# Patient Record
Sex: Male | Born: 1960 | Race: Black or African American | Hispanic: No | Marital: Single | Smoking: Current every day smoker
Health system: Southern US, Community
[De-identification: ages and names within clinical notes are randomized; demographics above are authoritative.]

## PROBLEM LIST (undated history)

## (undated) DIAGNOSIS — F419 Anxiety disorder, unspecified: Secondary | ICD-10-CM

## (undated) DIAGNOSIS — I2510 Atherosclerotic heart disease of native coronary artery without angina pectoris: Secondary | ICD-10-CM

## (undated) DIAGNOSIS — C499 Malignant neoplasm of connective and soft tissue, unspecified: Secondary | ICD-10-CM

## (undated) DIAGNOSIS — K219 Gastro-esophageal reflux disease without esophagitis: Secondary | ICD-10-CM

## (undated) DIAGNOSIS — R569 Unspecified convulsions: Secondary | ICD-10-CM

## (undated) DIAGNOSIS — Z720 Tobacco use: Secondary | ICD-10-CM

## (undated) DIAGNOSIS — I4891 Unspecified atrial fibrillation: Secondary | ICD-10-CM

## (undated) DIAGNOSIS — Z8719 Personal history of other diseases of the digestive system: Secondary | ICD-10-CM

## (undated) DIAGNOSIS — I428 Other cardiomyopathies: Secondary | ICD-10-CM

## (undated) DIAGNOSIS — I4892 Unspecified atrial flutter: Secondary | ICD-10-CM

## (undated) DIAGNOSIS — J189 Pneumonia, unspecified organism: Secondary | ICD-10-CM

## (undated) DIAGNOSIS — R0602 Shortness of breath: Secondary | ICD-10-CM

## (undated) DIAGNOSIS — I5022 Chronic systolic (congestive) heart failure: Secondary | ICD-10-CM

## (undated) DIAGNOSIS — R51 Headache: Secondary | ICD-10-CM

## (undated) HISTORY — PX: OTHER SURGICAL HISTORY: SHX169

## (undated) HISTORY — DX: Chronic systolic (congestive) heart failure: I50.22

## (undated) HISTORY — DX: Unspecified atrial fibrillation: I48.91

## (undated) HISTORY — DX: Other cardiomyopathies: I42.8

## (undated) HISTORY — DX: Atherosclerotic heart disease of native coronary artery without angina pectoris: I25.10

---
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
## 1989-06-01 HISTORY — PX: HIATAL HERNIA REPAIR: SHX195

## 2013-09-01 ENCOUNTER — Inpatient Hospital Stay (HOSPITAL_COMMUNITY)
Admission: EM | Admit: 2013-09-01 | Discharge: 2013-09-06 | DRG: 286 | Disposition: A | Discharge status: Home / Self Care | Payer: Medicaid Other | Attending: Internal Medicine | Admitting: Internal Medicine

## 2013-09-01 ENCOUNTER — Emergency Department (HOSPITAL_COMMUNITY): Payer: Medicaid Other

## 2013-09-01 ENCOUNTER — Encounter (HOSPITAL_COMMUNITY): Payer: Self-pay | Admitting: Emergency Medicine

## 2013-09-01 DIAGNOSIS — I428 Other cardiomyopathies: Secondary | ICD-10-CM | POA: Diagnosis present

## 2013-09-01 DIAGNOSIS — F17200 Nicotine dependence, unspecified, uncomplicated: Secondary | ICD-10-CM | POA: Diagnosis present

## 2013-09-01 DIAGNOSIS — I5022 Chronic systolic (congestive) heart failure: Secondary | ICD-10-CM

## 2013-09-01 DIAGNOSIS — E876 Hypokalemia: Secondary | ICD-10-CM | POA: Diagnosis not present

## 2013-09-01 DIAGNOSIS — Z23 Encounter for immunization: Secondary | ICD-10-CM | POA: Diagnosis not present

## 2013-09-01 DIAGNOSIS — I4891 Unspecified atrial fibrillation: Secondary | ICD-10-CM

## 2013-09-01 DIAGNOSIS — I2510 Atherosclerotic heart disease of native coronary artery without angina pectoris: Secondary | ICD-10-CM | POA: Diagnosis present

## 2013-09-01 DIAGNOSIS — I509 Heart failure, unspecified: Secondary | ICD-10-CM | POA: Diagnosis present

## 2013-09-01 DIAGNOSIS — Z923 Personal history of irradiation: Secondary | ICD-10-CM

## 2013-09-01 DIAGNOSIS — I4892 Unspecified atrial flutter: Principal | ICD-10-CM

## 2013-09-01 DIAGNOSIS — I4460 Unspecified fascicular block: Secondary | ICD-10-CM | POA: Diagnosis not present

## 2013-09-01 DIAGNOSIS — Z720 Tobacco use: Secondary | ICD-10-CM

## 2013-09-01 DIAGNOSIS — C499 Malignant neoplasm of connective and soft tissue, unspecified: Secondary | ICD-10-CM | POA: Insufficient documentation

## 2013-09-01 DIAGNOSIS — R7402 Elevation of levels of lactic acid dehydrogenase (LDH): Secondary | ICD-10-CM | POA: Diagnosis not present

## 2013-09-01 DIAGNOSIS — I5021 Acute systolic (congestive) heart failure: Secondary | ICD-10-CM | POA: Diagnosis present

## 2013-09-01 DIAGNOSIS — F1210 Cannabis abuse, uncomplicated: Secondary | ICD-10-CM | POA: Diagnosis present

## 2013-09-01 DIAGNOSIS — R748 Abnormal levels of other serum enzymes: Secondary | ICD-10-CM | POA: Diagnosis present

## 2013-09-01 DIAGNOSIS — R7401 Elevation of levels of liver transaminase levels: Secondary | ICD-10-CM

## 2013-09-01 DIAGNOSIS — Z85828 Personal history of other malignant neoplasm of skin: Secondary | ICD-10-CM | POA: Diagnosis not present

## 2013-09-01 DIAGNOSIS — I456 Pre-excitation syndrome: Secondary | ICD-10-CM | POA: Diagnosis not present

## 2013-09-01 DIAGNOSIS — I2589 Other forms of chronic ischemic heart disease: Secondary | ICD-10-CM | POA: Diagnosis present

## 2013-09-01 DIAGNOSIS — R002 Palpitations: Secondary | ICD-10-CM

## 2013-09-01 DIAGNOSIS — E049 Nontoxic goiter, unspecified: Secondary | ICD-10-CM | POA: Diagnosis present

## 2013-09-01 DIAGNOSIS — I10 Essential (primary) hypertension: Secondary | ICD-10-CM | POA: Diagnosis present

## 2013-09-01 DIAGNOSIS — R6881 Early satiety: Secondary | ICD-10-CM | POA: Diagnosis present

## 2013-09-01 HISTORY — DX: Malignant neoplasm of connective and soft tissue, unspecified: C49.9

## 2013-09-01 HISTORY — DX: Tobacco use: Z72.0

## 2013-09-01 HISTORY — DX: Unspecified atrial flutter: I48.92

## 2013-09-01 LAB — POCT I-STAT TROPONIN I: Troponin i, poc: 0.11 ng/mL (ref 0.00–0.08)

## 2013-09-01 LAB — BASIC METABOLIC PANEL
BUN: 21 mg/dL (ref 6–23)
CO2: 24 mEq/L (ref 19–32)
CO2: 26 mEq/L (ref 19–32)
Calcium: 8.6 mg/dL (ref 8.4–10.5)
Chloride: 96 mEq/L (ref 96–112)
Creatinine, Ser: 1.4 mg/dL — ABNORMAL HIGH (ref 0.50–1.35)
GFR calc non Af Amer: 56 mL/min — ABNORMAL LOW (ref >90)
Glucose, Bld: 119 mg/dL — ABNORMAL HIGH (ref 70–99)
Potassium: 3.4 mEq/L — ABNORMAL LOW (ref 3.5–5.1)
Potassium: 3.4 mEq/L — ABNORMAL LOW (ref 3.5–5.1)
Sodium: 135 mEq/L (ref 135–145)
Sodium: 136 mEq/L (ref 135–145)

## 2013-09-01 LAB — URINALYSIS, ROUTINE W REFLEX MICROSCOPIC
Glucose, UA: NEGATIVE mg/dL
Hgb urine dipstick: NEGATIVE
Ketones, ur: 15 mg/dL — AB
Leukocytes, UA: NEGATIVE
Protein, ur: NEGATIVE mg/dL
Urobilinogen, UA: 1 mg/dL (ref 0.0–1.0)

## 2013-09-01 LAB — HEPARIN LEVEL (UNFRACTIONATED): Heparin Unfractionated: <0.1 IU/mL — ABNORMAL LOW (ref 0.30–0.70)

## 2013-09-01 LAB — CBC
Platelets: 214 10*3/uL (ref 150–400)
RBC: 4.14 MIL/uL — ABNORMAL LOW (ref 4.22–5.81)
WBC: 7.9 10*3/uL (ref 4.0–10.5)

## 2013-09-01 LAB — TSH: TSH: 0.818 u[IU]/mL (ref 0.350–4.500)

## 2013-09-01 LAB — RAPID URINE DRUG SCREEN, HOSP PERFORMED
Amphetamines: NOT DETECTED
Benzodiazepines: NOT DETECTED
Opiates: NOT DETECTED
Tetrahydrocannabinol: POSITIVE — AB

## 2013-09-01 LAB — CK TOTAL AND CKMB (NOT AT ARMC): Relative Index: 2.1 (ref 0.0–2.5)

## 2013-09-01 LAB — T3, FREE: T3, Free: 3.4 pg/mL (ref 2.3–4.2)

## 2013-09-01 LAB — HEMOGLOBIN A1C
Hgb A1c MFr Bld: 5.7 % — ABNORMAL HIGH (ref <5.7)
Mean Plasma Glucose: 117 mg/dL — ABNORMAL HIGH (ref <117)

## 2013-09-01 LAB — MRSA PCR SCREENING: MRSA by PCR: NEGATIVE

## 2013-09-01 LAB — PRO B NATRIURETIC PEPTIDE: Pro B Natriuretic peptide (BNP): 5936 pg/mL — ABNORMAL HIGH (ref 0–125)

## 2013-09-01 LAB — CK: Total CK: 354 U/L — ABNORMAL HIGH (ref 7–232)

## 2013-09-01 LAB — HEPATIC FUNCTION PANEL
Albumin: 3.4 g/dL — ABNORMAL LOW (ref 3.5–5.2)
Alkaline Phosphatase: 75 U/L (ref 39–117)
Bilirubin, Direct: 0.2 mg/dL (ref 0.0–0.3)
Indirect Bilirubin: 0.7 mg/dL (ref 0.3–0.9)
Total Bilirubin: 0.9 mg/dL (ref 0.3–1.2)

## 2013-09-01 LAB — MAGNESIUM: Magnesium: 1.9 mg/dL (ref 1.5–2.5)

## 2013-09-01 LAB — TROPONIN I: Troponin I: 0.34 ng/mL (ref <0.30)

## 2013-09-01 MED ORDER — PNEUMOCOCCAL VAC POLYVALENT 25 MCG/0.5ML IJ INJ
0.5000 mL | INJECTION | INTRAMUSCULAR | Status: DC
  Filled 2013-09-01 (×2): qty 0.5

## 2013-09-01 MED ORDER — DILTIAZEM HCL 25 MG/5ML IV SOLN
10.0000 mg | Freq: Once | INTRAVENOUS | Status: AC
  Administered 2013-09-01: 10 mg via INTRAVENOUS

## 2013-09-01 MED ORDER — LORAZEPAM 2 MG/ML IJ SOLN
1.0000 mg | Freq: Once | INTRAMUSCULAR | Status: AC
  Administered 2013-09-01: 1 mg via INTRAVENOUS
  Filled 2013-09-01: qty 1

## 2013-09-01 MED ORDER — DIPHENHYDRAMINE HCL 50 MG PO CAPS
50.0000 mg | ORAL_CAPSULE | Freq: Once | ORAL | Status: AC
  Administered 2013-09-01: 50 mg via ORAL
  Filled 2013-09-01: qty 1

## 2013-09-01 MED ORDER — METOPROLOL TARTRATE 1 MG/ML IV SOLN
5.0000 mg | Freq: Four times a day (QID) | INTRAVENOUS | Status: DC | PRN
  Administered 2013-09-01: 5 mg via INTRAVENOUS
  Filled 2013-09-01: qty 5

## 2013-09-01 MED ORDER — DILTIAZEM HCL 100 MG IV SOLR
20.0000 mg/h | INTRAVENOUS | Status: DC
  Administered 2013-09-01 (×2): 20 mg/h via INTRAVENOUS
  Administered 2013-09-01 (×2): 5 mg/h via INTRAVENOUS
  Filled 2013-09-01: qty 100

## 2013-09-01 MED ORDER — POTASSIUM CHLORIDE CRYS ER 20 MEQ PO TBCR
40.0000 meq | EXTENDED_RELEASE_TABLET | Freq: Two times a day (BID) | ORAL | Status: AC
  Administered 2013-09-01 (×2): 40 meq via ORAL
  Filled 2013-09-01: qty 2

## 2013-09-01 MED ORDER — METOPROLOL TARTRATE 1 MG/ML IV SOLN
5.0000 mg | Freq: Once | INTRAVENOUS | Status: AC
  Administered 2013-09-01: 5 mg via INTRAVENOUS
  Filled 2013-09-01: qty 5

## 2013-09-01 MED ORDER — LORAZEPAM 2 MG/ML IJ SOLN
1.0000 mg | Freq: Once | INTRAMUSCULAR | Status: AC
  Administered 2013-09-01: 1 mg via INTRAVENOUS

## 2013-09-01 MED ORDER — FUROSEMIDE 10 MG/ML IJ SOLN
80.0000 mg | Freq: Once | INTRAMUSCULAR | Status: AC
  Administered 2013-09-01: 80 mg via INTRAVENOUS
  Filled 2013-09-01: qty 8

## 2013-09-01 MED ORDER — PROPRANOLOL HCL 1 MG/ML IV SOLN
1.0000 mg | INTRAVENOUS | Status: AC
  Administered 2013-09-01: 1 mg via INTRAVENOUS
  Filled 2013-09-01: qty 1

## 2013-09-01 MED ORDER — ALPRAZOLAM 0.5 MG PO TABS
0.5000 mg | ORAL_TABLET | Freq: Once | ORAL | Status: AC
  Administered 2013-09-01: 0.5 mg via ORAL
  Filled 2013-09-01: qty 1

## 2013-09-01 MED ORDER — OXYCODONE HCL 5 MG PO TABS
5.0000 mg | ORAL_TABLET | ORAL | Status: DC | PRN
  Filled 2013-09-01: qty 1

## 2013-09-01 MED ORDER — HEPARIN BOLUS VIA INFUSION
2500.0000 [IU] | Freq: Once | INTRAVENOUS | Status: AC
  Administered 2013-09-01: 2500 [IU] via INTRAVENOUS
  Filled 2013-09-01: qty 2500

## 2013-09-01 MED ORDER — HYDROCORTISONE SOD SUCCINATE 100 MG IJ SOLR
100.0000 mg | Freq: Once | INTRAMUSCULAR | Status: AC
  Administered 2013-09-01: 100 mg via INTRAVENOUS
  Filled 2013-09-01: qty 2

## 2013-09-01 MED ORDER — FUROSEMIDE 10 MG/ML IJ SOLN
40.0000 mg | Freq: Once | INTRAMUSCULAR | Status: AC
  Administered 2013-09-01: 40 mg via INTRAVENOUS
  Filled 2013-09-01: qty 4

## 2013-09-01 MED ORDER — METOPROLOL TARTRATE 1 MG/ML IV SOLN
5.0000 mg | Freq: Four times a day (QID) | INTRAVENOUS | Status: DC
  Administered 2013-09-01 – 2013-09-02 (×2): 5 mg via INTRAVENOUS
  Filled 2013-09-01 (×6): qty 5

## 2013-09-01 MED ORDER — SODIUM CHLORIDE 0.9 % IJ SOLN: 3.0000 mL | Freq: Two times a day (BID) | INTRAMUSCULAR | Status: DC

## 2013-09-01 MED ORDER — FUROSEMIDE 10 MG/ML IJ SOLN
40.0000 mg | Freq: Two times a day (BID) | INTRAMUSCULAR | Status: DC
  Administered 2013-09-01 – 2013-09-03 (×4): 40 mg via INTRAVENOUS
  Filled 2013-09-01 (×6): qty 4

## 2013-09-01 MED ORDER — ZOLPIDEM TARTRATE 5 MG PO TABS: 5.0000 mg | ORAL_TABLET | Freq: Every evening | ORAL | Status: DC | PRN

## 2013-09-01 MED ORDER — POLYETHYLENE GLYCOL 3350 17 G PO PACK
17.0000 g | PACK | Freq: Every day | ORAL | Status: DC | PRN
  Filled 2013-09-01: qty 1

## 2013-09-01 MED ORDER — SODIUM CHLORIDE 0.9 % IV SOLN: 250.0000 mL | INTRAVENOUS | Status: DC | PRN

## 2013-09-01 MED ORDER — SODIUM CHLORIDE 0.9 % IV BOLUS (SEPSIS)
1000.0000 mL | Freq: Once | INTRAVENOUS | Status: AC
  Administered 2013-09-01: 1000 mL via INTRAVENOUS

## 2013-09-01 MED ORDER — METOPROLOL TARTRATE 1 MG/ML IV SOLN: 5.0000 mg | Freq: Four times a day (QID) | INTRAVENOUS | Status: DC | PRN

## 2013-09-01 MED ORDER — HEPARIN (PORCINE) IN NACL 100-0.45 UNIT/ML-% IJ SOLN
1650.0000 [IU]/h | INTRAMUSCULAR | Status: DC
  Administered 2013-09-01: 1450 [IU]/h via INTRAVENOUS
  Administered 2013-09-01: 1200 [IU]/h via INTRAVENOUS
  Filled 2013-09-01 (×4): qty 250

## 2013-09-01 MED ORDER — SODIUM CHLORIDE 0.9 % IJ SOLN: 3.0000 mL | INTRAMUSCULAR | Status: DC | PRN

## 2013-09-01 MED ORDER — HEPARIN BOLUS VIA INFUSION
3000.0000 [IU] | Freq: Once | INTRAVENOUS | Status: AC
  Administered 2013-09-01: 3000 [IU] via INTRAVENOUS
  Filled 2013-09-01: qty 3000

## 2013-09-01 MED ORDER — SODIUM CHLORIDE 0.9 % IV SOLN: INTRAVENOUS | Status: DC

## 2013-09-01 MED ORDER — DILTIAZEM HCL 25 MG/5ML IV SOLN
10.0000 mg | Freq: Once | INTRAVENOUS | Status: AC
  Administered 2013-09-01: 10 mg via INTRAVENOUS
  Filled 2013-09-01: qty 5

## 2013-09-01 MED ORDER — SODIUM CHLORIDE 0.9 % IV SOLN: 250.0000 mL | INTRAVENOUS | Status: DC

## 2013-09-01 MED ORDER — ONDANSETRON HCL 4 MG/2ML IJ SOLN: 4.0000 mg | Freq: Four times a day (QID) | INTRAMUSCULAR | Status: DC | PRN

## 2013-09-01 MED ORDER — INFLUENZA VAC SPLIT QUAD 0.5 ML IM SUSP
0.5000 mL | INTRAMUSCULAR | Status: DC
  Filled 2013-09-01 (×2): qty 0.5

## 2013-09-01 MED ORDER — IOHEXOL 350 MG/ML SOLN
80.0000 mL | Freq: Once | INTRAVENOUS | Status: AC | PRN
  Administered 2013-09-01: 100 mL via INTRAVENOUS

## 2013-09-01 MED ORDER — SODIUM CHLORIDE 0.9 % IJ SOLN
3.0000 mL | Freq: Two times a day (BID) | INTRAMUSCULAR | Status: DC
  Administered 2013-09-01: 3 mL via INTRAVENOUS

## 2013-09-01 MED ORDER — SODIUM CHLORIDE 0.9 % IJ SOLN
3.0000 mL | Freq: Two times a day (BID) | INTRAMUSCULAR | Status: DC
  Administered 2013-09-02: 3 mL via INTRAVENOUS

## 2013-09-01 MED ORDER — ONDANSETRON HCL 4 MG PO TABS: 4.0000 mg | ORAL_TABLET | Freq: Four times a day (QID) | ORAL | Status: DC | PRN

## 2013-09-01 NOTE — ED Notes (Signed)
Contacted bed control regarding delay in bed assignment, states there are not any step down level beds available

## 2013-09-01 NOTE — ED Notes (Signed)
MD at bedside. 

## 2013-09-01 NOTE — Progress Notes (Signed)
ANTICOAGULATION CONSULT NOTE - Initial Consult  Pharmacy Consult for heparin Indication: aflutter  Allergies  Allergen Reactions  . Acetaminophen     "shaky" "makes me angry"  . Peanut Butter Flavor     Breathing problems, swells lips    Patient Measurements:    Dosing Weight: 200#  Vital Signs: Temp: 97 F (36.1 C) (12/02 0234) Temp src: Oral (12/02 0234) BP: 138/104 mmHg (12/02 0828) Pulse Rate: 72 (12/02 0828)  Labs:  Recent Labs  09/01/13 0249 09/01/13 0531  HGB 14.6  --   HCT 41.0  --   PLT 214  --   CREATININE 1.19  --   CKTOTAL  --  354*    CrCl is unknown because there is no height on file for the current visit.   Medical History: Past Medical History  Diagnosis Date  . Granular cell myoblastoma, malignant     a. myoblastoma  approx 1998, in remission. underwent gamma knife  procedure  . Atrial flutter with rapid ventricular response   . Tobacco abuse     16 pack history   . Congestive heart failure     Medications:  See med rec  Assessment: 52 yo man to start heparin for a flutter.   Goal of Therapy:  Heparin level 0.3-0.7 units/ml Monitor platelets by anticoagulation protocol: Yes   Plan:  Give 3000 units bolus x 1 Start heparin infusion at 1200 units/hr Check anti-Xa level in 6 hours and daily while on heparin Continue to monitor H&H and platelets  Kasiya Burck Poteet 09/01/2013,9:02 AM

## 2013-09-01 NOTE — ED Notes (Signed)
Patient without c/os of chest pain noted rhythm 143, patient appears anxious

## 2013-09-01 NOTE — ED Notes (Addendum)
Paged admitting MD to update on persistent tachycardia and elevated Troponin, MD advises that no further interventions needed at this time if Pt's BP stable and is chest pain free

## 2013-09-01 NOTE — Consult Note (Signed)
CARDIOLOGY CONSULT NOTE   Patient ID: Justin Huerta MRN: [id number] DOB/AGE: [birth year] 54 y.o.  Admit date: 09/01/2013  Primary Physician  Dr. Trinda Pascal in [place]. Primary Cardiologist   New Reason for Consultation  Atrial flutter  HPI: Justin Huerta is a 52 y.o. male with a history of tobacco abuse, myoblastoma in remission and no past cardiac history who presented to the ED with atrial flutter with RVR. He was in his usual state of health until approx 2 weeks ago when he noticed fatigue, diaphoresis, palpitations and SOB. He went to the pharmacy and took his blood pressure; systolic was in the 180s and his HR 157. His symptoms come and go and have become worse over the past couple days. He has not slept for last two days secondary to agitation and sense of impending doom.  He admits to worsening SOB with chest tightness at rest, 3 pillow orthopnea, PND and early satiety for the past couple weeks, as well as bloating and gas x2 days. He reports significant belching, indigestion like symptoms and sharp "gas like" pain in precordial chest region and right shoulder. He reports lightheadedness, dizziness and two episodes of presyncope. No fevers, chills, n/v, or abdominal pain. He states that he noticed many of these symptoms about 1 week after he tore his right hamstring in late September. He had significant hematomas and swelling since that time.  Of note patient reports that when he was in highschool he had an episode of syncope on the bus home from a football game. He went to the doctor where they found his heart rate to be elevated. He was lost to follow up.  Patient has a 16 pack year history of smoking, occasionally drinks and uses mariajuana. He takes no other medications.   In the ED EKG revealed atrial flutter with 2:1 A-V conduction (HR: 145), left anterior fascicular block, LVH with QRS widening and repolarization abnormality. Troponin 0.11, CK 354 Creat: 1.19 BNP 5936 D. Dimer  1.65 CXR and CT reveal cardiomegaluy, pulm edema and bilateral pleural effusions   Past Medical History  Diagnosis Date  . Granular cell myoblastoma, malignant     a. myoblastoma  approx 1998, in remission. underwent gamma knife  procedure  . Atrial flutter with rapid ventricular response   . Tobacco abuse     16 pack history   . Congestive heart failure      Past Surgical History  Procedure Laterality Date  . Knee surgery    . Hiatal hernia repair    . Gamma knife surgery      myoblastoma ~1998    Allergies  Allergen Reactions  . Acetaminophen     "shaky" "makes me angry"  . Peanut Butter Flavor     Breathing problems, swells lips    I have reviewed the patient's current medications   . diltiazem (CARDIZEM) infusion 15 mg/hr (09/01/13 0445)     Prior to Admission medications   Medication Sig Start Date End Date Taking? Authorizing Provider  CINNAMON PO Take 1 capsule by mouth daily.   Yes Historical Provider, MD  Diphenhydramine-APAP, sleep, (EXCEDRIN PM PO) Take 1-2 tablets by mouth at bedtime as needed (sleep aid).   Yes Historical Provider, MD  simethicone (MYLICON) 80 MG chewable tablet Chew 80 mg by mouth once.   Yes Historical Provider, MD     History   Social History  . Marital Status: Single    Spouse Name: N/A    Number of Children:  2  . Years of Education: N/A   Occupational History  . Sales     Self employed    Social History Main Topics  . Smoking status: Current Every Day Smoker -- 1.00 packs/day for 16 years    Types: Cigarettes  . Smokeless tobacco: Not on file  . Alcohol Use: Yes  . Drug Use: No     Comment: marajuana occasionally   . Sexual Activity: Not on file   Other Topics Concern  . 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 He has 2 sons and lives with wife.     Family Status  Relation Status Death Age  . Mother Alive     copd, lung fibrosis  . Father  Deceased     does not know anything about father    Family History  Problem Relation Age of Onset  . Heart attack Mother     alive     ROS:  Full 14 point review of systems complete and found to be negative unless listed above.  Physical Exam: Blood pressure 138/104, pulse 72, temperature 97 F (36.1 C), temperature source Oral, resp. rate 26, SpO2 100.00%.  General: Well developed, well nourished, male in mild distress Head: Eyes PERRLA, No xanthomas.   Normocephalic and atraumatic, oropharynx without edema or exudate. Dentition:  Lungs: Difficult to assess, crackles at bases, patient extremely SOB while laying in bed Heart: Tachycardic. Heart regular rate and rhythm with S1, S2. No murmur. pulses are 2+ extrem.   Neck: No carotid bruits. No lymphadenopathy.  No JVD. Smooth, right sided thickening in neck in area of thyroid Abdomen: Bowel sounds present, abdomen mildly distended Msk:  Right LE with skin changes and swelling secondary to hamstring tear Extremities: No clubbing or cyanosis. 1+ pitting edema to shins  Neuro: Alert and oriented X 3. No focal deficits noted. Psych:  Good affect, responds appropriately. Seems agitated Skin: No rashes or lesions noted.  Labs:   Lab Results  Component Value Date   WBC 7.9 09/01/2013   HGB 14.6 09/01/2013   HCT 41.0 09/01/2013   MCV 99.0 09/01/2013   PLT 214 09/01/2013     Recent Labs Lab 09/01/13 0249 09/01/13 0742  NA 135  --   K 3.4*  --   CL 96  --   CO2 24  --   BUN 17  --   CREATININE 1.19  --   CALCIUM 8.8  --   PROT  --  7.2  BILITOT  --  0.9  ALKPHOS  --  75  ALT  --  130*  AST  --  60*  GLUCOSE 125*  --   ALBUMIN  --  3.4*    Recent Labs  09/01/13 0531  CKTOTAL 354*    Recent Labs  09/01/13 0257  TROPIPOC 0.11*   Pro B Natriuretic peptide (BNP)  Date/Time Value Range Status  09/01/2013  2:49 AM 5936.0* 0 - 125 pg/mL Final    Lab Results  Component Value Date   DDIMER 1.65* 09/01/2013    Echo:     ECG:  09/01/13 HR: 145 Atrial flutter with 2:1 A-V conduction Left anterior fascicular block Left ventricular hypertrophy with QRS widening and repolarization abnormality Cannot rule out Septal infarct , age undetermined Abnormal ECG  Radiology:  Ct Angio Chest Pe W/cm &/or Wo Cm  09/01/2013   CLINICAL DATA:  Mid chest pressure/tightness  EXAM: CT ANGIOGRAPHY CHEST WITH CONTRAST  TECHNIQUE: Multidetector CT imaging of the chest was performed using the standard protocol during bolus administration of intravenous contrast. Multiplanar CT image reconstructions including MIPs were obtained to evaluate the vascular anatomy.  CONTRAST:  OMNIPAQUE IOHEXOL 350 MG/ML SOLN  COMPARISON:  None.  FINDINGS: Lower lobe segmental pulmonary arterial branches are nondiagnostic due to respiratory motion. Within this limitation, no pulmonary embolism identified elsewhere. The main pulmonary artery dilates up to 3.8 cm.  Scattered atherosclerosis of the aorta and branch vessels. Mild focal outpouching along the arch, with the aorta measuring up to 3.4 cm. Trace pericardial fluid. Cardiomegaly. Coronary artery calcification. Small right and trace left pleural effusions.  Central airways are patent. Peribronchial thickening. Lower lobe interlobular septal thickening. Dependent and/or compressive lower lobe atelectasis.  Limited upper abdominal images show a few hypodensities within the left hepatic lobe, favored reflects cyst. Partially imaged cyst arising from the upper pole left kidney measures water attenuation. Mildly prominent mediastinal lymph nodes measuring up to 1 cm short axis, likely reactive.  No acute osseous finding.  Review of the MIP images confirms the above findings.  IMPRESSION: Degraded by respiratory motion. Lower lobe segmental and more peripheral branches are nondiagnostic. Otherwise, no pulmonary embolism identified.  Dilatation of the main pulmonary artery can be seen in the setting of pulmonary  arterial hypertension.  Cardiomegaly with small pleural effusions and a pulmonary edema pattern.     Dg Chest Port 1 View  09/01/2013   CLINICAL DATA:  Chest pain, tightness, shortness of breath  EXAM: PORTABLE CHEST - 1 VIEW  COMPARISON:  09/01/2013 chest CT  FINDINGS: Cardiomegaly. Central pulmonary vascular prominence. Interstitial prominence Probable small effusions. Mild bibasilar atelectasis. No pneumothorax. No acute osseous finding.  IMPRESSION: Prominent cardiomediastinal contours. Interstitial prominence may reflect interstitial edema.  Trace effusions and mild lung base opacities; atelectasis versus infiltrate.     ASSESSMENT AND PLAN:    Active Problems:   Atrial flutter with rapid ventricular response   Tobacco abuse     Justin Huerta is a 52 y.o. male with a history of tobacco abuse, myoblastoma in remission and no past cardiac history who presented to the ED with atrial flutter with RVR. He was in his usual state of health until approx 2 weeks ago when he noticed fatigue, diaphoresis, palpitations and SOB.   1. Atrial Flutter: 2:1 A-V conduction (HR: 145) -- On 15mg /hr diltiazem drip with HR in remaining in 140s, will increase to 20 mg/hr and reassess HR and BP -- TSH and free T4 pending -- Chest tightness with palpiations-- troponin 0.11. No acute EKG changes but difficult to assess while in flutter. Pending CK-MB and serial troponin q6hr -- Will schedule TEE with cardioversion tomorrow if he does not convert into NSR before then -- Heparin per pharmacy    2. Acute congestive heart failure- s/s of acute pulmonary edema, suspect this is secondary to atrial flutter -- BNP 5936, Creat: 1.19, CXR/CT reveal cardiomegaly, pulm edema and bilateral pleural effusions -- Given IV Lasix 80 mg in ED: -2,500 in past hour with marked symptomatic improvement -- ECHO today to assess LV function -- Pulmonary embolism ruled out with CT  3. Thyroid enlargement- Thyroid storm possible  etiology for tachyarrhythmia.  -- TSH and free T4 ordered   4. Tobacco Abuse- complete cessation counseled    Signed: Thereasa Parkin, PA-C 09/01/2013 8:59 AM   Co-Sign MD   The patient was seen, examined and discussed with Justin Huerta  Justin Maxon, PA-C and agree as above.  A pleasant 52 y.o. male with a history of tobacco abuse, myoblastoma in remission and no past cardiac history who presented to the ED with stage IV heart failure symptoms and was found to be in atrial flutter with RVR and significant hypertension. He was in his usual state of health until approx 2 weeks ago when he noticed fatigue, diaphoresis, palpitations and SOB. He is now SOB at rest with significant diuresis and symptoms improvement after initial dose of IV Lasix. The patient was started on IV Cardizem, we will uptitrate and add Heparin drip. We will plan for a TEE cardioversion if he doesn't spontaneously cardiovert by tomorrow.  There is a finding of a thyroid nodule on the physical exam and suspicion for a hyperthyroidism. Thyroid labs are pending. Echocardiogram is pending. There is a mild troponin elevation, this is most probably secondary to demand ischemia, we will continue with serial troponins (and CK-MB), he is currently chest pain free. We will continue iv Heparin drip. We will be able to evaluate ECG once not in flutter. We will follow echocardiogram for regional wall motion abnormalities.    Justin Huerta, Rexene Edison 09/01/2013

## 2013-09-01 NOTE — ED Notes (Signed)
MD at bedside. hospitalist admitting patient to their service

## 2013-09-01 NOTE — Care Management Note (Addendum)
    Page 1 of 2   09/04/2013     4:25:34 PM   CARE MANAGEMENT NOTE 09/04/2013  Patient:  Justin Huerta, Justin Huerta   Account Number:  [id number]  Date Initiated:  09/01/2013  Documentation initiated by:  Junius Creamer  Subjective/Objective Assessment:   adm w at fib w rvr     Action/Plan:   lives w fam   Anticipated DC Date:  09/05/2013   Anticipated DC Plan:  HOME/SELF CARE      DC Planning Services  CM consult  Medication Assistance      Choice offered to / List presented to:     DME arranged  [facility] - [facility]ST      DME agency  OTHER - SEE NOTE        Status of service:  In process, will continue to follow Medicare Important Message given?   (If response is "NO", the following Medicare IM given date fields will be blank) Date Medicare IM given:   Date Additional Medicare IM given:    Discharge Disposition:    Per UR Regulation:  Reviewed for med. necessity/level of care/duration of stay  If discussed at Long Length of Stay Meetings, dates discussed:    Comments:  09-04-13 [address] Tomi Bamberger, RN,BSN [phone] CM did speak to pt and provided pt with 30 day free card of xarelto. MD please write Rx for 30 day free no refills. CM did place the pt assistance form in the shadow chart. MD please fill out to see if pt eligible for year supply.  Pt will fax information to [employer]. Please relay to pt if samples available to contact cardiology office if the pt assistance program has not approved pt before 30 day supply is done. CM did contact [employer] rep Tresa Endo and a  lease was approved via [facility]. Tresa Endo will fit pt tonight and per RN Otelia Santee pt will be d/c Sat. Xanax is inexpensive. Please make sure pt will be on all generic medications. CM did make pt aware of [facility] $4.00 med list. CM also told pt that [facility] [address] will be best pharmacy to use xarelto 30 day free card. Medication available. CM did call the [facility] for f/u PCP apt-Placed in  Epic.  No further needs from CM at this time.

## 2013-09-01 NOTE — ED Notes (Signed)
Patient transported to CT 

## 2013-09-01 NOTE — ED Notes (Signed)
Cardiology at bedside.

## 2013-09-01 NOTE — Progress Notes (Signed)
PHARMACY FOLLOW UP NOTE   Pharmacy Consult for : Heparin Indication: Atrial Flutter  Dosing Weight: 88 kg  Labs:  Recent Labs  09/01/13 0249 09/01/13 1640  HGB 14.6  --   HCT 41.0  --   PLT 214  --   HEPARINUNFRC  --  <0.10*   Estimated Creatinine Clearance: 75.6 ml/min (by C-G formula based on Cr of 1.19).  Pertinent Medications:  Infusion:  . HEPARIN 1,200 Units/hr (09/01/13 1011)    Assessment:  52 y/o male on Heparin infusion for Atrial Flutter.  Heparin infusing at 1200 units/hr.  Heparin level SUBtherapeutic < 0.1 units/ml.  No bleeding complications noted   Goal:  Heparin level 0.3-0.7 units/ml   Plan: 1. Heparin bolus 2500 units x 1, then increase Heparin infusion to 1450 units/hr. 2. Will Check Hep Level in 6 hours 3. Daily Heparin Levels, CBC.  Monitor for bleeding complications    Rayford Halsted.D 09/01/2013, 6:08 PM

## 2013-09-01 NOTE — Progress Notes (Signed)
Converted to NSR confirmed by 12 lead Ekg, , MD aware with order.

## 2013-09-01 NOTE — H&P (Signed)
[facility] History and Physical  Justin Huerta ZOX:[id number] DOB: 09-Dec-1960 DOA: 09/01/2013  Referring physician: none PCP: No PCP Per Patient  Specialists: cardiology  Chief Complaint: SOB and Palpitation  HPI: Justin Huerta is a 52 y.o. male  With past mental history of ongoing tobacco use, and marijuana use, status post surgical removal of a myoblastoma currently in remission, that comes in for shortness of breath and palpitations intermittently for about 2 weeks. He relates he has progressively gotten worse. He relates he can lay flat in bed as he gets short of breath. He relates cannot walk on her feet without being short of breath. He relates that 2 days prior to admission had an episode of palpitation diaphoresis and shortness of breath went to the pharmacy check his blood pressure and it was 180 his heart rate was more than 150 as per patient. He went home on the night of omission he got the same episode and decided to come to the emergency room.  In the ED: D-dimer was done which was positive CT imaging of the chest was negative for PE pro BNP was 6000% of cardiac enzymes was 0.11 a basic metabolic panel was done that shows a potassium of 3.4 hepatic function panel showed an elevation in AST and ALT 60/130, CBC was within normal limits his UDS was positive for marijuana, cardiology was consulted.  Review of Systems: The patient denies anorexia, fever, weight loss,, vision loss, decreased hearing, hoarseness, , dyspnea on exertion, peripheral edema, balance deficits, hemoptysis, abdominal pain, melena, hematochezia, severe indigestion/heartburn, hematuria, incontinence, genital sores, muscle weakness, suspicious skin lesions, transient blindness, difficulty walking, depression, unusual weight change, abnormal bleeding, enlarged lymph nodes, angioedema, and breast masses.    Past Medical History  Diagnosis Date  . Granular cell myoblastoma, malignant     a. myoblastoma  approx  1998, in remission. underwent gamma knife  procedure  . Atrial flutter with rapid ventricular response   . Tobacco abuse     16 pack history   . Congestive heart failure    Past Surgical History  Procedure Laterality Date  . Knee surgery    . Hiatal hernia repair    . Gamma knife surgery      myoblastoma ~1998   Social History:  reports that he has been smoking Cigarettes.  He has a 16 pack-year smoking history. He does not have any smokeless tobacco history on file. He reports that he drinks alcohol. He reports that he does not use illicit drugs. Is at home with wife, does recreational marijuana  Allergies  Allergen Reactions  . Acetaminophen     "shaky" "makes me angry"  . Peanut Butter Flavor     Breathing problems, swells lips    Family History  Problem Relation Age of Onset  . Heart attack Mother     alive    Prior to Admission medications   Medication Sig Start Date End Date Taking? Authorizing Provider  CINNAMON PO Take 1 capsule by mouth daily.   Yes Historical Provider, MD  Diphenhydramine-APAP, sleep, (EXCEDRIN PM PO) Take 1-2 tablets by mouth at bedtime as needed (sleep aid).   Yes Historical Provider, MD  simethicone (MYLICON) 80 MG chewable tablet Chew 80 mg by mouth once.   Yes Historical Provider, MD   Physical Exam: Filed Vitals:   09/01/13 0828  BP: 138/104  Pulse: 72  Temp:   Resp:     BP 138/104  Pulse 72  Temp(Src) 97 F (36.1 C) (Oral)  Resp 26  Wt 90.719 kg (200 lb)  SpO2 100%  General Appearance:    Alert, cooperative, no distress, appears stated age              Throat:   Lips, mucosa, and tongue normal  Neck:   Supple, symmetrical, trachea midline, no adenopathy;     :  + HJ, + JVD     Lungs:     Clear to auscultation bilaterally, respirations unlabored     Heart:   tachycardic rate and rhythm, S1 and S2 normal, no murmur, rub   or gallop  Abdomen:     Soft, non-tender, bowel sounds active all four quadrants,    no masses, no  organomegaly        Extremities:   Extremities normal, atraumatic, no cyanosis or edema  Pulses:   2+ and symmetric all extremities     Lymph nodes:   Cervical, supraclavicular, and axillary nodes normal  Neurologic:   CNII-XII intact. Normal strength, sensation and reflexes      throughout     Labs on Admission:  Basic Metabolic Panel:  Recent Labs Lab 09/01/13 0249 09/01/13 0742  NA 135  --   K 3.4*  --   CL 96  --   CO2 24  --   GLUCOSE 125*  --   BUN 17  --   CREATININE 1.19  --   CALCIUM 8.8  --   MG  --  1.9   Liver Function Tests:  Recent Labs Lab 09/01/13 0742  AST 60*  ALT 130*  ALKPHOS 75  BILITOT 0.9  PROT 7.2  ALBUMIN 3.4*   No results found for this basename: LIPASE, AMYLASE,  in the last 168 hours No results found for this basename: AMMONIA,  in the last 168 hours CBC:  Recent Labs Lab 09/01/13 0249  WBC 7.9  HGB 14.6  HCT 41.0  MCV 99.0  PLT 214   Cardiac Enzymes:  Recent Labs Lab 09/01/13 0531 09/01/13 0946  CKTOTAL 354* 374*  CKMB  --  7.8*    BNP (last 3 results)  Recent Labs  09/01/13 0249  PROBNP 5936.0*   CBG: No results found for this basename: GLUCAP,  in the last 168 hours  Radiological Exams on Admission: Ct Angio Chest Pe W/cm &/or Wo Cm  09/01/2013   CLINICAL DATA:  Mid chest pressure/tightness  EXAM: CT ANGIOGRAPHY CHEST WITH CONTRAST  TECHNIQUE: Multidetector CT imaging of the chest was performed using the standard protocol during bolus administration of intravenous contrast. Multiplanar CT image reconstructions including MIPs were obtained to evaluate the vascular anatomy.  CONTRAST:  OMNIPAQUE IOHEXOL 350 MG/ML SOLN  COMPARISON:  None.  FINDINGS: Lower lobe segmental pulmonary arterial branches are nondiagnostic due to respiratory motion. Within this limitation, no pulmonary embolism identified elsewhere. The main pulmonary artery dilates up to 3.8 cm.  Scattered atherosclerosis of the aorta and branch  vessels. Mild focal outpouching along the arch, with the aorta measuring up to 3.4 cm. Trace pericardial fluid. Cardiomegaly. Coronary artery calcification. Small right and trace left pleural effusions.  Central airways are patent. Peribronchial thickening. Lower lobe interlobular septal thickening. Dependent and/or compressive lower lobe atelectasis.  Limited upper abdominal images show a few hypodensities within the left hepatic lobe, favored reflects cyst. Partially imaged cyst arising from the upper pole left kidney measures water attenuation. Mildly prominent mediastinal lymph nodes measuring up to 1 cm short axis, likely reactive.  No acute osseous finding.  Review of the MIP images confirms the above findings.  IMPRESSION: Degraded by respiratory motion. Lower lobe segmental and more peripheral branches are nondiagnostic. Otherwise, no pulmonary embolism identified.  Dilatation of the main pulmonary artery can be seen in the setting of pulmonary arterial hypertension.  Cardiomegaly with small pleural effusions and a pulmonary edema pattern.   Electronically Signed   By: Jearld Lesch M.D.   On: 09/01/2013 04:53   Dg Chest Port 1 View  09/01/2013   CLINICAL DATA:  Chest pain, tightness, shortness of breath  EXAM: PORTABLE CHEST - 1 VIEW  COMPARISON:  09/01/2013 chest CT  FINDINGS: Cardiomegaly. Central pulmonary vascular prominence. Interstitial prominence Probable small effusions. Mild bibasilar atelectasis. No pneumothorax. No acute osseous finding.  IMPRESSION: Prominent cardiomediastinal contours. Interstitial prominence may reflect interstitial edema.  Trace effusions and mild lung base opacities; atelectasis versus infiltrate.   Electronically Signed   By: Jearld Lesch M.D.   On: 09/01/2013 05:35    EKG: Independently reviewed. Atrial flutter, with a heart rate of 145 his QRS is 116 has a left axis deviation and possible LVH pattern.  Assessment/Plan Atrial flutter with rapid ventricular  response - Cardiology was consulted by the emergency room physician, he is on a diltiazem drip at 20 and his heart rate remained above 140. He was given 2 doses of metoprolol 5 mg, and his HR continues to be greater than 140. It could be that it is acute decompensated heart failure could be contributing to his heart rate.  - May need cardioversion for heart rate control. There is at this point no instability his blood pressure stable, is not complaining of any chest pain or shortness of breath.It would be beneficial to his amiodarone will help control his heart rate. - Agree with IV heparin - TSH free T4 pending. Tobacco abuse: - he has not smoked in over 3 days. Once this cardiac issues are better controlled cancer Chantix.  Acute systolic CHF (congestive heart failure), NYHA class 2: - Positive JVD and positive hepatojugular reflux, I agree with Lasix given.  -Will continue IV Lasix at 40 mg IV twice a day strict I.'s and O.'s replete potassium and electrolytes as needed. A 2-D echo  Cannabis abuse, continuous use - Counseling.  Transaminitis - We'll check hepatitis panel, This could be all passive congestion from his acute decompensated heart failure.  Cardiology consulted  Code Status: full Family Communication: wife Disposition Plan: inpatient  Time spent: 80 minutes  Marinda Elk [facility] Pager [phone]  If 7PM-7AM, please contact night-coverage [email or web address] Password [facility] 09/01/2013, 10:42 AM

## 2013-09-01 NOTE — ED Provider Notes (Signed)
CSN: [id number]     Arrival date & time 09/01/13  0229 History   First MD Initiated Contact with Patient 09/01/13 0248     Chief Complaint  Patient presents with  . Chest Pain   (Consider location/radiation/quality/duration/timing/severity/associated sxs/prior Treatment) HPI  patient presents with 3 days of intermittent palpitations associated with shortness of breath and anxiety. States he went to a pharmacy 3 days ago and had elevated heart rate and elevated blood pressure. Was told by the pharmacist to see a doctor immediately. This evening around midnight patient had acute onset of palpitations associated with shortness of breath. He became quite anxious and came to the emergency department. At no point did he have any chest pain. He's had no fevers or chills. He denies any abdominal pain, nausea, vomiting or diarrhea. He states that he "tore" his right hamstring several weeks ago. He has had persistent right leg pain and swelling. He has had no recent surgeries or extended travel. Past Medical History  Diagnosis Date  . Cancer    Past Surgical History  Procedure Laterality Date  . Knee surgery     No family history on file. History  Substance Use Topics  . Smoking status: Current Every Day Smoker  . Smokeless tobacco: Not on file  . Alcohol Use: Yes    Review of Systems  Constitutional: Positive for diaphoresis. Negative for fever, chills and fatigue.  HENT: Negative for trouble swallowing.   Respiratory: Negative for cough, chest tightness, shortness of breath and wheezing.   Cardiovascular: Positive for palpitations and leg swelling. Negative for chest pain.  Gastrointestinal: Negative for nausea, vomiting, abdominal pain, diarrhea and constipation.  Musculoskeletal: Negative for back pain.  Skin: Negative for rash and wound.  Neurological: Negative for dizziness, syncope, weakness, light-headedness, numbness and headaches.  Psychiatric/Behavioral: Positive for sleep  disturbance and agitation. The patient is nervous/anxious.   All other systems reviewed and are negative.    Allergies  Review of patient's allergies indicates no known allergies.  Home Medications  No current outpatient prescriptions on file. BP 161/119  Pulse 145  Temp(Src) 97 F (36.1 C) (Oral)  Resp 20  SpO2 100% Physical Exam  Nursing note and vitals reviewed. Constitutional: He is oriented to person, place, and time. He appears well-developed and well-nourished.  Anxious appearing  HENT:  Head: Normocephalic and atraumatic.  Mouth/Throat: Oropharynx is clear and moist. No oropharyngeal exudate.  Eyes: EOM are normal. Pupils are equal, round, and reactive to light.  Neck: Normal range of motion. Neck supple.  Cardiovascular: Exam reveals no gallop and no friction rub.   No murmur heard. Irregularly irregular tachycardia  Pulmonary/Chest: Effort normal and breath sounds normal. No respiratory distress. He has no wheezes. He has no rales. He exhibits no tenderness.  Abdominal: Soft. Bowel sounds are normal. He exhibits no distension and no mass. There is no tenderness. There is no rebound and no guarding.  Musculoskeletal: Normal range of motion. He exhibits tenderness (mild right proximal hamstring tenderness. No calf swelling or tenderness.). He exhibits no edema.  Neurological: He is alert and oriented to person, place, and time.  Moves all extremities without deficit. Sensation grossly intact.  Skin: Skin is warm. No rash noted. He is diaphoretic. No erythema.  Psychiatric:  Agitated and anxious.    ED Course  Procedures (including critical care time) Labs Review Labs Reviewed  CBC - Abnormal; Notable for the following:    RBC 4.14 (*)    MCH 35.3 (*)  All other components within normal limits  POCT I-STAT TROPONIN I - Abnormal; Notable for the following:    Troponin i, poc 0.11 (*)    All other components within normal limits  BASIC METABOLIC PANEL  PRO B  NATRIURETIC PEPTIDE  TSH  D-DIMER, QUANTITATIVE  URINALYSIS, ROUTINE W REFLEX MICROSCOPIC  URINE RAPID DRUG SCREEN (HOSP PERFORMED)   Imaging Review No results found.  EKG Interpretation    Date/Time:  Tuesday September 01 2013 02:35:10 EST Ventricular Rate:  145 PR Interval:    QRS Duration: 116 QT Interval:  278 QTC Calculation: 431 R Axis:   -45 Text Interpretation:  Atrial flutter with 2:1 A-V conduction Left anterior fascicular block Left ventricular hypertrophy with QRS widening and repolarization abnormality Cannot rule out Septal infarct , age undetermined Abnormal ECG Confirmed by Ranae Palms  MD, Latandra Loureiro ([id number]) on 09/01/2013 6:07:28 AM            MDM   Discussed with Dr. Milinda Cave, was on call for cardiology. Will see patient in the emergency department. Advised to have medicine consult as well.  Concern for thyroid storm. Patient's heart rate remains elevated despite Cardizem drip. Will give trial propranolol and hydrocortisone.  Discussed with [facility]. Will admit to step down bed.  Loren Racer, MD 09/01/13 [phone]

## 2013-09-01 NOTE — ED Notes (Signed)
Pt. reports mid chest pressure/tightness with SOB this evening , unable to lie down due to chest tightness , denies nausea or diaphoresis .

## 2013-09-02 ENCOUNTER — Encounter (HOSPITAL_COMMUNITY)
Admission: EM | Disposition: A | Discharge status: Home / Self Care | Payer: Self-pay | Source: Home / Self Care | Attending: Internal Medicine

## 2013-09-02 DIAGNOSIS — I359 Nonrheumatic aortic valve disorder, unspecified: Secondary | ICD-10-CM

## 2013-09-02 LAB — BASIC METABOLIC PANEL
Chloride: 100 mEq/L (ref 96–112)
GFR calc Af Amer: 74 mL/min — ABNORMAL LOW (ref >90)
GFR calc non Af Amer: 64 mL/min — ABNORMAL LOW (ref >90)
Potassium: 3.1 mEq/L — ABNORMAL LOW (ref 3.5–5.1)
Sodium: 141 mEq/L (ref 135–145)

## 2013-09-02 LAB — HEPATITIS PANEL, ACUTE
HCV Ab: NEGATIVE
Hep A IgM: NONREACTIVE
Hep B C IgM: NONREACTIVE
Hepatitis B Surface Ag: NEGATIVE

## 2013-09-02 LAB — COMPREHENSIVE METABOLIC PANEL
AST: 44 U/L — ABNORMAL HIGH (ref 0–37)
Albumin: 3.2 g/dL — ABNORMAL LOW (ref 3.5–5.2)
BUN: 22 mg/dL (ref 6–23)
CO2: 27 mEq/L (ref 19–32)
Calcium: 8.4 mg/dL (ref 8.4–10.5)
Chloride: 99 mEq/L (ref 96–112)
Creatinine, Ser: 1.24 mg/dL (ref 0.50–1.35)
GFR calc non Af Amer: 64 mL/min — ABNORMAL LOW (ref >90)
Total Bilirubin: 0.6 mg/dL (ref 0.3–1.2)

## 2013-09-02 LAB — CBC
Hemoglobin: 13.7 g/dL (ref 13.0–17.0)
Hemoglobin: 13.6 g/dL (ref 13.0–17.0)
MCH: 35.1 pg — ABNORMAL HIGH (ref 26.0–34.0)
MCV: 99.7 fL (ref 78.0–100.0)
MCV: 99.5 fL (ref 78.0–100.0)
Platelets: 194 10*3/uL (ref 150–400)
RBC: 3.89 MIL/uL — ABNORMAL LOW (ref 4.22–5.81)
RBC: 3.88 MIL/uL — ABNORMAL LOW (ref 4.22–5.81)
RDW: 12.6 % (ref 11.5–15.5)
WBC: 8 10*3/uL (ref 4.0–10.5)

## 2013-09-02 LAB — LIPID PANEL
HDL: 35 mg/dL — ABNORMAL LOW (ref >39)
LDL Cholesterol: 111 mg/dL — ABNORMAL HIGH (ref 0–99)
Total CHOL/HDL Ratio: 4.7 RATIO
VLDL: 17 mg/dL (ref 0–40)

## 2013-09-02 LAB — HEPARIN LEVEL (UNFRACTIONATED)
Heparin Unfractionated: 0.24 IU/mL — ABNORMAL LOW (ref 0.30–0.70)
Heparin Unfractionated: 0.15 IU/mL — ABNORMAL LOW (ref 0.30–0.70)

## 2013-09-02 LAB — MAGNESIUM: Magnesium: 1.9 mg/dL (ref 1.5–2.5)

## 2013-09-02 SURGERY — ECHOCARDIOGRAM, TRANSESOPHAGEAL
Anesthesia: Moderate Sedation

## 2013-09-02 MED ORDER — ASPIRIN 81 MG PO CHEW
81.0000 mg | CHEWABLE_TABLET | ORAL | Status: AC
  Administered 2013-09-03: 81 mg via ORAL
  Filled 2013-09-02: qty 1

## 2013-09-02 MED ORDER — ASPIRIN 81 MG PO CHEW: 324.0000 mg | CHEWABLE_TABLET | Freq: Once | ORAL | Status: DC

## 2013-09-02 MED ORDER — CARVEDILOL 6.25 MG PO TABS
6.2500 mg | ORAL_TABLET | Freq: Two times a day (BID) | ORAL | Status: DC
  Administered 2013-09-02 – 2013-09-04 (×4): 6.25 mg via ORAL
  Filled 2013-09-02 (×7): qty 1

## 2013-09-02 MED ORDER — HEPARIN BOLUS VIA INFUSION
2500.0000 [IU] | Freq: Once | INTRAVENOUS | Status: AC
  Administered 2013-09-02: 2500 [IU] via INTRAVENOUS
  Filled 2013-09-02: qty 2500

## 2013-09-02 MED ORDER — ZOLPIDEM TARTRATE 5 MG PO TABS
5.0000 mg | ORAL_TABLET | Freq: Once | ORAL | Status: AC
  Administered 2013-09-05: 5 mg via ORAL
  Filled 2013-09-02 (×2): qty 1

## 2013-09-02 MED ORDER — HEPARIN (PORCINE) IN NACL 100-0.45 UNIT/ML-% IJ SOLN
2000.0000 [IU]/h | INTRAMUSCULAR | Status: DC
  Administered 2013-09-02 (×2): 2000 [IU]/h via INTRAVENOUS
  Filled 2013-09-02 (×5): qty 250

## 2013-09-02 MED ORDER — ALPRAZOLAM 0.5 MG PO TABS
0.5000 mg | ORAL_TABLET | Freq: Three times a day (TID) | ORAL | Status: DC | PRN
  Administered 2013-09-02 – 2013-09-05 (×5): 0.5 mg via ORAL
  Filled 2013-09-02 (×5): qty 1

## 2013-09-02 MED ORDER — SODIUM CHLORIDE 0.9 % IJ SOLN: 3.0000 mL | Freq: Two times a day (BID) | INTRAMUSCULAR | Status: DC

## 2013-09-02 MED ORDER — ASPIRIN 81 MG PO CHEW: 81.0000 mg | CHEWABLE_TABLET | Freq: Once | ORAL | Status: DC

## 2013-09-02 MED ORDER — SODIUM CHLORIDE 0.9 % IV SOLN: INTRAVENOUS | Status: DC

## 2013-09-02 MED ORDER — DIAZEPAM 5 MG PO TABS
5.0000 mg | ORAL_TABLET | ORAL | Status: AC
  Administered 2013-09-03: 5 mg via ORAL
  Filled 2013-09-02: qty 1

## 2013-09-02 MED ORDER — POTASSIUM CHLORIDE CRYS ER 20 MEQ PO TBCR: 40.0000 meq | EXTENDED_RELEASE_TABLET | Freq: Once | ORAL | Status: DC

## 2013-09-02 MED ORDER — POTASSIUM CHLORIDE 10 MEQ/100ML IV SOLN: 10.0000 meq | INTRAVENOUS | Status: DC

## 2013-09-02 MED ORDER — POTASSIUM CHLORIDE CRYS ER 20 MEQ PO TBCR
40.0000 meq | EXTENDED_RELEASE_TABLET | Freq: Once | ORAL | Status: AC
  Administered 2013-09-02: 40 meq via ORAL
  Filled 2013-09-02: qty 2

## 2013-09-02 MED ORDER — SODIUM CHLORIDE 0.9 % IV SOLN: 1.0000 mL/kg/h | INTRAVENOUS | Status: DC

## 2013-09-02 MED ORDER — LISINOPRIL 5 MG PO TABS
5.0000 mg | ORAL_TABLET | Freq: Every day | ORAL | Status: DC
  Administered 2013-09-02 – 2013-09-06 (×4): 5 mg via ORAL
  Filled 2013-09-02 (×6): qty 1

## 2013-09-02 MED ORDER — METOPROLOL TARTRATE 25 MG PO TABS: 25.0000 mg | ORAL_TABLET | Freq: Two times a day (BID) | ORAL | Status: DC

## 2013-09-02 MED ORDER — SODIUM CHLORIDE 0.9 % IV SOLN: 250.0000 mL | INTRAVENOUS | Status: DC | PRN

## 2013-09-02 MED ORDER — POTASSIUM CHLORIDE CRYS ER 20 MEQ PO TBCR
40.0000 meq | EXTENDED_RELEASE_TABLET | Freq: Two times a day (BID) | ORAL | Status: DC
  Administered 2013-09-03 – 2013-09-05 (×5): 40 meq via ORAL
  Filled 2013-09-02 (×8): qty 2

## 2013-09-02 MED ORDER — SODIUM CHLORIDE 0.9 % IJ SOLN: 3.0000 mL | INTRAMUSCULAR | Status: DC | PRN

## 2013-09-02 MED ORDER — DILTIAZEM HCL 90 MG PO TABS
90.0000 mg | ORAL_TABLET | Freq: Four times a day (QID) | ORAL | Status: DC
  Filled 2013-09-02 (×4): qty 1

## 2013-09-02 MED ORDER — POTASSIUM CHLORIDE CRYS ER 20 MEQ PO TBCR
40.0000 meq | EXTENDED_RELEASE_TABLET | ORAL | Status: AC
  Administered 2013-09-02 (×3): 40 meq via ORAL
  Filled 2013-09-02: qty 2
  Filled 2013-09-02: qty 4
  Filled 2013-09-02 (×2): qty 2

## 2013-09-02 MED ORDER — POTASSIUM CHLORIDE 10 MEQ/100ML IV SOLN
10.0000 meq | INTRAVENOUS | Status: AC
  Administered 2013-09-02 (×4): 10 meq via INTRAVENOUS
  Filled 2013-09-02 (×4): qty 100

## 2013-09-02 NOTE — Plan of Care (Signed)
Problem: Consults Goal: Tobacco Cessation referral if indicated Outcome: Completed/Met Date Met:  09/02/13 Reviewed tobacco cessation material

## 2013-09-02 NOTE — Progress Notes (Signed)
ANTICOAGULATION CONSULT NOTE Pharmacy Consult for heparin Indication: aflutter  Allergies  Allergen Reactions  . Acetaminophen     "shaky" "makes me angry"  . Peanut Butter Flavor     Breathing problems, swells lips    Patient Measurements: Height: 5\' 11"  (180.3 cm) Weight: 194 lb 7.1 oz (88.2 kg) IBW/kg (Calculated) : 75.3  Dosing Weight: 200#  Vital Signs: Temp: 97.7 F (36.5 C) (12/03 0000) Temp src: Oral (12/03 0000) BP: 121/93 mmHg (12/03 0000)  Labs:  Recent Labs  09/01/13 0249 09/01/13 0531 09/01/13 0946 09/01/13 1640 09/01/13 2005 09/02/13 0028  HGB 14.6  --   --   --   --  13.7  HCT 41.0  --   --   --   --  38.8*  PLT 214  --   --   --   --  194  HEPARINUNFRC  --   --   --  <0.10*  --  0.24*  CREATININE 1.19  --   --   --  1.40* 1.24  1.25  CKTOTAL  --  354* 374*  --   --   --   CKMB  --   --  7.8*  --   --   --   TROPONINI  --   --  0.37* 0.33* 0.34*  --     Estimated Creatinine Clearance: 72.5 ml/min (by C-G formula based on Cr of 1.24).  Assessment: 52 yo male with Aflutter for heparin 1600 units/hr    Goal of Therapy:  Heparin level 0.3-0.7 units/ml Monitor platelets by anticoagulation protocol: Yes   Plan:  Increase Heparin 1650 units/hr    Shirlean Berman, Gary Fleet 09/02/2013,3:34 AM

## 2013-09-02 NOTE — Progress Notes (Signed)
  Echocardiogram 2D Echocardiogram has been performed.  Crescencio Jozwiak, [facility] 09/02/2013, 11:44 AM

## 2013-09-02 NOTE — Progress Notes (Signed)
Pt c/o "panic attack' symptoms, has history of panic attacks, Dr. Butler Denmark notified, order given, Berle Mull RN

## 2013-09-02 NOTE — Progress Notes (Signed)
Patient Name: Justin Huerta Date of Encounter: 09/02/2013     Active Problems:   Atrial flutter with rapid ventricular response   Tobacco abuse   Acute systolic CHF (congestive heart failure), NYHA class 2   Cannabis abuse, continuous use   Transaminitis    SUBJECTIVE Patient is seen lying in bed today. He reports being much more comfortable today with no SOB at rest. He has not been ambulating, but does not feel SOB when he sits up in bed. He slept well last night with no PND. He continues to have 3 pillow orthopnea, although this is chronic for him. No lightheadedness, dizziness, n/v, abdominal pain or edema. He states that he "feels great" while lying in bed.   CURRENT MEDS . diltiazem  90 mg Oral Q6H  . furosemide  40 mg Intravenous Q12H  . influenza vac split quadrivalent PF  0.5 mL Intramuscular Tomorrow-1000  . metoprolol tartrate  25 mg Oral BID  . pneumococcal 23 valent vaccine  0.5 mL Intramuscular Tomorrow-1000  . [START ON 09/03/2013] potassium chloride  40 mEq Oral BID  . potassium chloride  40 mEq Oral Once  . sodium chloride  3 mL Intravenous Q12H  . sodium chloride  3 mL Intravenous Q12H  . sodium chloride  3 mL Intravenous Q12H    OBJECTIVE  Filed Vitals:   09/02/13 0424 09/02/13 0736 09/02/13 0932 09/02/13 1117  BP:  133/107  125/79  Pulse:  113  91  Temp:  98.5 F (36.9 C)  98.4 F (36.9 C)  TempSrc:  Oral  Oral  Resp:  22  23  Height:      Weight: 196 lb 13.9 oz (89.3 kg)     SpO2:  96% 96% 100%    Intake/Output Summary (Last 24 hours) at 09/02/13 1358 Last data filed at 09/02/13 1000  Gross per 24 hour  Intake 1446.75 ml  Output   3475 ml  Net -2028.25 ml   Filed Weights   09/01/13 0947 09/01/13 1523 09/02/13 0424  Weight: 200 lb (90.719 kg) 194 lb 7.1 oz (88.2 kg) 196 lb 13.9 oz (89.3 kg)    PHYSICAL EXAM  General: Pleasant, NAD. Looking much more comfortable today Neuro: Alert and oriented X 3. Moves all extremities  spontaneously. Psych: Normal affect. HEENT:  Normal  Neck: Supple without bruits or No JVD.  Smooth, right sided thryroid hypertrophy   Lungs:  Resp regular and unlabored, very mild crackles at bases Heart: RRR no s3, s4, or murmurs. Abdomen: Soft, non-tender, non-distended Extremities: Pulses 2+ BL, chronic swelling and skin changes in right leg 2/2 hamstring tear in late Sept. No pitting edema  Accessory Clinical Findings  CBC  Recent Labs  09/02/13 0028 09/02/13 0940  WBC 8.0 6.3  HGB 13.7 13.6  HCT 38.8* 38.6*  MCV 99.7 99.5  PLT 194 185   Basic Metabolic Panel  Recent Labs  09/01/13 0742 09/01/13 2005 09/02/13 0028  NA  --  136 139  141  K  --  3.4* 3.0*  3.1*  CL  --  97 99  100  CO2  --  26 27  27   GLUCOSE  --  119* 90  90  BUN  --  21 22  22   CREATININE  --  1.40* 1.24  1.25  CALCIUM  --  8.6 8.4  8.3*  MG 1.9  --  1.9   Liver Function Tests  Recent Labs  09/01/13 0742 09/02/13 0028  AST  60* 44*  ALT 130* 107*  ALKPHOS 75 66  BILITOT 0.9 0.6  PROT 7.2 6.6  ALBUMIN 3.4* 3.2*   No results found for this basename: LIPASE, AMYLASE,  in the last 72 hours Cardiac Enzymes  Recent Labs  09/01/13 0531 09/01/13 0946 09/01/13 1640 09/01/13 2005  CKTOTAL 354* 374*  --   --   CKMB  --  7.8*  --   --   TROPONINI  --  0.37* 0.33* 0.34*   BNP No components found with this basename: POCBNP,  D-Dimer  Recent Labs  09/01/13 0309  DDIMER 1.65*   Hemoglobin A1C  Recent Labs  09/01/13 1640  HGBA1C 5.7*    Thyroid Function Tests  Recent Labs  09/01/13 0309 09/01/13 0742  TSH 0.818  --   T3FREE  --  3.4    TELE  HR: 75 bmp NSR  ECG 09/01/13 ~ 5pm Deep T wave inversion and ant  Radiology/Studies  Ct Angio Chest Pe W/cm &/or Wo Cm  09/01/2013   CLINICAL DATA:  Mid chest pressure/tightness  EXAM: CT ANGIOGRAPHY CHEST WITH CONTRAST  TECHNIQUE: Multidetector CT imaging of the chest was performed using the standard protocol  during bolus administration of intravenous contrast. Multiplanar CT image reconstructions including MIPs were obtained to evaluate the vascular anatomy.  CONTRAST:  OMNIPAQUE IOHEXOL 350 MG/ML SOLN  COMPARISON:  None.  FINDINGS: Lower lobe segmental pulmonary arterial branches are nondiagnostic due to respiratory motion. Within this limitation, no pulmonary embolism identified elsewhere. The main pulmonary artery dilates up to 3.8 cm.  Scattered atherosclerosis of the aorta and branch vessels. Mild focal outpouching along the arch, with the aorta measuring up to 3.4 cm. Trace pericardial fluid. Cardiomegaly. Coronary artery calcification. Small right and trace left pleural effusions.  Central airways are patent. Peribronchial thickening. Lower lobe interlobular septal thickening. Dependent and/or compressive lower lobe atelectasis.  Limited upper abdominal images show a few hypodensities within the left hepatic lobe, favored reflects cyst. Partially imaged cyst arising from the upper pole left kidney measures water attenuation. Mildly prominent mediastinal lymph nodes measuring up to 1 cm short axis, likely reactive.  No acute osseous finding.  Review of the MIP images confirms the above findings.  IMPRESSION: Degraded by respiratory motion. Lower lobe segmental and more peripheral branches are nondiagnostic. Otherwise, no pulmonary embolism identified.  Dilatation of the main pulmonary artery can be seen in the setting of pulmonary arterial hypertension.  Cardiomegaly with small pleural effusions and a pulmonary edema pattern.   Electronically Signed   By: Jearld Lesch M.D.   On: 09/01/2013 04:53   Dg Chest Port 1 View  09/01/2013   CLINICAL DATA:  Chest pain, tightness, shortness of breath  EXAM: PORTABLE CHEST - 1 VIEW  COMPARISON:  09/01/2013 chest CT  FINDINGS: Cardiomegaly. Central pulmonary vascular prominence. Interstitial prominence Probable small effusions. Mild bibasilar atelectasis. No  pneumothorax. No acute osseous finding.  IMPRESSION: Prominent cardiomediastinal contours. Interstitial prominence may reflect interstitial edema.  Trace effusions and mild lung base opacities; atelectasis versus infiltrate.   Electronically Signed   By: Jearld Lesch M.D.   On: 09/01/2013 05:35   TTE 09/02/2013 Study Conclusions  - Left ventricle: The cavity size was moderately dilated. Wall thickness was increased in a pattern of mild LVH. Systolic function was moderately to severely reduced. The estimated ejection fraction was in the range of 30% to 35%. Diffuse hypokinesis. Left ventricular diastolic function parameters were normal. - Aortic valve: Mild regurgitation. - Mitral  valve: Mild regurgitation. - Left atrium: The atrium was mildly to moderately dilated. - Right atrium: The atrium was mildly dilated. - Pulmonary arteries: PA peak pressure: 45mm Hg (S).     ASSESSMENT AND PLAN  Jermari Tamargo is a 52 y.o. male with a history of tobacco abuse, myoblastoma in remission and no past cardiac history who presented to the ED with atrial flutter with RVR, hypertension and florid congestive heart failure.   1. Atrial Flutter: patient came in with 2:1 A-V conduction (HR: 145)  -- Patient's HR was initially difficult to control, however patient broke rhythm yesterday ~5pm. He remains in NSR, however, his EKG is abnormal with anterolateral T wave inversions, poor R wave progression, possible Q waves in V1 and V2 and mild ST deviation -- Will convert to oral Dilt 90mg  q6 hrs and metoprolol 25 mg BID. If Echo reveals low EF will discontinue dilt and up-titrate metoprolol -- On heparin per pharmacy   2. Acute congestive heart failure- s/s of acute pulmonary edema, suspect this is secondary to atrial flutter  -- BNP 5936, Creat: 1.19, CXR/CT reveal cardiomegaly, pulm edema and bilateral pleural effusions  -- Elevated transaminases, normalizing with diuresis  -- Marked symptomatic  improvement on IV lasix:  net output ~8L, down - 4lbs  -- Continue IV Lasix 40 mg BID -- ECHO to assess LV function pending   3. Elevated troponin- mild (.34), likely due to demand ischemia and/or heart failure -- Currently without chest pain -- CAD risk factors of 16 pack year tobacco abuse and family history of heart disease (mother and sister) -- Heart score: 5 -- Await ECHO; if EF down then consider cath in the setting of ST changes.   4. Hypertension -- BP normalized with current therapy, 125/79 now  5. Hypokalemia -- 3.0 today, supplemented with Kdur 40 mg po bid   6. Thyroid enlargement -- Thyroid studies normal, follow up as an outpatient   7. Tobacco Abuse -- Patient reports that he quit 7 days ago   Signed, Thereasa Parkin PA-C  The patient was seen, examined and discussed with Thereasa Parkin, PA-C and agree as above.  The patient cardioverted spontaneously at 5 am into SR with a very short PR. 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 EP (Dr Graciela Husbands) was consulted for a possible ablation and consideration of aberrancy.  Echocardiogram showed moderately dilated LV with LV EF 30-35% and moderately enlarged left atrium. This is suggestive of rather chronic than acute heart failure. ECG is significantly abnormal with diffuse negative T waves. We will schedule the patient for a cath tomorrow. If negative, tachycardia induced CMP should be considered.   Tobias Alexander, Rexene Edison 09/02/2013

## 2013-09-02 NOTE — Consult Note (Signed)
 ELECTROPHYSIOLOGY CONSULT NOTE  Patient ID: Justin Huerta, MRN: [id number], DOB/AGE: 54/20/1962 54 y.o. Admit date: 09/01/2013 Date of Consult: 09/02/2013  Primary Physician: No PCP Per Patient Primary Cardiologist: KN  Chief Complaint:  CHF Aflutter   HPI Justin Huerta is a 52 y.o. male  seen for atrial flutter.  The patient presented with a 3-to four-month history of progressive shortness of breath manifested by dyspnea on exertion notable the top of stairs. Over the last couple of weeks he has noted, in addition to the above, tachypalpitations which were provoked by exercise. Notably, in-hospital, when his atrial flutter rate was improved (?) w heart rate of 138, he was unaware of palpitations. Over the last couple of days prior to presentation he had increasing shortness of breath early satiety nocturnal dyspnea and orthopnea.  He was also noted exercise intolerance accompanied by chest discomfort which he describes as a pressure without radiation. This is the most notable in the last 3-4 months as well. Electrocardiogram on arrival notable for  deep T-wave inversions across the anterior precordium; he does have a history of hypertension. He converted spontaneously to sinus rhythm.  He has a history of syncope. Approximately 3 weeks ago he fell without warning. The episode was quite brief period he has also had various episodes of presyncope over the last couple of weeks accompanied by shortness of breath.  He has a history of myoblastoma which was treated with radiation therapy and gamma knife surgery but no chemotherapy            Past Medical History  Diagnosis Date  . Granular cell myoblastoma, malignant     a. myoblastoma  approx 1998, in remission. underwent gamma knife  procedure  . Atrial flutter with rapid ventricular response   . Tobacco abuse     16 pack history   . Congestive heart failure       Surgical History:  Past Surgical History  Procedure  Laterality Date  . Knee surgery    . Hiatal hernia repair    . Gamma knife surgery      myoblastoma ~1998     Home Meds: Prior to Admission medications   Medication Sig Start Date End Date Taking? Authorizing Provider  CINNAMON PO Take 1 capsule by mouth daily.   Yes Historical Provider, MD  Diphenhydramine-APAP, sleep, (EXCEDRIN PM PO) Take 1-2 tablets by mouth at bedtime as needed (sleep aid).   Yes Historical Provider, MD  simethicone (MYLICON) 80 MG chewable tablet Chew 80 mg by mouth once.   Yes Historical Provider, MD    Inpatient Medications:  . diltiazem  90 mg Oral Q6H  . furosemide  40 mg Intravenous Q12H  . influenza vac split quadrivalent PF  0.5 mL Intramuscular Tomorrow-1000  . metoprolol tartrate  25 mg Oral BID  . pneumococcal 23 valent vaccine  0.5 mL Intramuscular Tomorrow-1000  . [START ON 09/03/2013] potassium chloride  40 mEq Oral BID  . potassium chloride  40 mEq Oral Once  . sodium chloride  3 mL Intravenous Q12H  . sodium chloride  3 mL Intravenous Q12H  . sodium chloride  3 mL Intravenous Q12H    Allergies:  Allergies  Allergen Reactions  . Acetaminophen     "shaky" "makes me angry"  . Peanut Butter Flavor     Breathing problems, swells lips    History   Social History  . Marital Status: Single    Spouse Name: N/A    Number of Children: 2  .   Years of Education: N/A   Occupational History  . Sales     Self employed    Social History Main Topics  . Smoking status: Current Every Day Smoker -- 1.00 packs/day for 16 years    Types: Cigarettes  . Smokeless tobacco: Not on file  . Alcohol Use: Yes  . Drug Use: No     Comment: marajuana occasionally   . Sexual Activity: Yes   Other Topics Concern  . 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 He has 2 sons and lives with wife.      Family History  Problem Relation Age of Onset  . Heart attack Mother     alive      ROS:  Please see the history of present illness.     All other systems reviewed and negative.    Physical Exam: Blood pressure 125/79, pulse 91, temperature 98.4 F (36.9 C), temperature source Oral, resp. rate 23, height 5' 11" (1.803 m), weight 196 lb 13.9 oz (89.3 kg), SpO2 100.00%. General: Well developed, well nourished male in no acute distress. Head: Normocephalic, atraumatic, sclera non-icteric, no xanthomas, nares are without discharge. EENT: normal Lymph Nodes:  none Back: without scoliosis/kyphosis , no CVA tendersness Neck: Negative for carotid bruits. JVD 7 with HJR Lungs: Clear bilaterally to auscultation without wheezes, rales, or rhonchi. Breathing is unlabored. Heart: RRR with S1 S2.  2/6 systolic  murmur , rubs, or gallops appreciated. Abdomen: Soft, non-tender, non-distended with normoactive bowel sounds. No hepatomegaly. No rebound/guarding. No obvious abdominal masses. Msk:  Strength and tone appear normal for age. Extremities: No clubbing or cyanosis. No  edema.  Distal pedal pulses are 2+ and equal bilaterally. Skin: Warm and Dry Neuro: Alert and oriented X 3. CN III-XII intact Grossly normal sensory and motor function . Psych:  Responds to questions appropriately with a normal affect.      Labs: Cardiac Enzymes  Recent Labs  09/01/13 0531 09/01/13 0946 09/01/13 1640 09/01/13 2005  CKTOTAL 354* 374*  --   --   CKMB  --  7.8*  --   --   TROPONINI  --  0.37* 0.33* 0.34*   CBC Lab Results  Component Value Date   WBC 6.3 09/02/2013   HGB 13.6 09/02/2013   HCT 38.6* 09/02/2013   MCV 99.5 09/02/2013   PLT 185 09/02/2013   PROTIME: No results found for this basename: LABPROT, INR,  in the last 72 hours Chemistry  Recent Labs Lab 09/02/13 0028  NA 139  141  K 3.0*  3.1*  CL 99  100  CO2 27  27  BUN 22  22  CREATININE 1.24  1.25  CALCIUM 8.4  8.3*  PROT 6.6  BILITOT 0.6  ALKPHOS 66  ALT 107*  AST 44*  GLUCOSE 90  90   Lipids Lab  Results  Component Value Date   CHOL 163 09/02/2013   HDL 35* 09/02/2013   LDLCALC 111* 09/02/2013   TRIG 86 09/02/2013   BNP Pro B Natriuretic peptide (BNP)  Date/Time Value Range Status  09/01/2013  2:49 AM 5936.0* 0 - 125 pg/mL Final   Miscellaneous Lab Results  Component Value Date   DDIMER 1.65* 09/01/2013    Radiology/Studies:  Ct Angio Chest Pe W/cm &/or Wo Cm  09/01/2013   CLINICAL DATA:  Mid chest pressure/tightness  EXAM: CT ANGIOGRAPHY CHEST WITH CONTRAST  TECHNIQUE: Multidetector CT imaging   of the chest was performed using the standard protocol during bolus administration of intravenous contrast. Multiplanar CT image reconstructions including MIPs were obtained to evaluate the vascular anatomy.  CONTRAST:  100mL OMNIPAQUE IOHEXOL 350 MG/ML SOLN  COMPARISON:  None.  FINDINGS: Lower lobe segmental pulmonary arterial branches are nondiagnostic due to respiratory motion. Within this limitation, no pulmonary embolism identified elsewhere. The main pulmonary artery dilates up to 3.8 cm.  Scattered atherosclerosis of the aorta and branch vessels. Mild focal outpouching along the arch, with the aorta measuring up to 3.4 cm. Trace pericardial fluid. Cardiomegaly. Coronary artery calcification. Small right and trace left pleural effusions.  Central airways are patent. Peribronchial thickening. Lower lobe interlobular septal thickening. Dependent and/or compressive lower lobe atelectasis.  Limited upper abdominal images show a few hypodensities within the left hepatic lobe, favored reflects cyst. Partially imaged cyst arising from the upper pole left kidney measures water attenuation. Mildly prominent mediastinal lymph nodes measuring up to 1 cm short axis, likely reactive.  No acute osseous finding.  Review of the MIP images confirms the above findings.  IMPRESSION: Degraded by respiratory motion. Lower lobe segmental and more peripheral branches are nondiagnostic. Otherwise, no pulmonary embolism  identified.  Dilatation of the main pulmonary artery can be seen in the setting of pulmonary arterial hypertension.  Cardiomegaly with small pleural effusions and a pulmonary edema pattern.   Electronically Signed   By: Andrew  DelGaizo M.D.   On: 09/01/2013 04:53   Dg Chest Port 1 View  09/01/2013   CLINICAL DATA:  Chest pain, tightness, shortness of breath  EXAM: PORTABLE CHEST - 1 VIEW  COMPARISON:  09/01/2013 chest CT  FINDINGS: Cardiomegaly. Central pulmonary vascular prominence. Interstitial prominence Probable small effusions. Mild bibasilar atelectasis. No pneumothorax. No acute osseous finding.  IMPRESSION: Prominent cardiomediastinal contours. Interstitial prominence may reflect interstitial edema.  Trace effusions and mild lung base opacities; atelectasis versus infiltrate.   Electronically Signed   By: Andrew  DelGaizo M.D.   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  The fact that the patient was unaware of his tachycardia when in atrial flutter races the hope that his cardiomyopathy is tachycardia-induced, and not withstanding the fact that his heart failure symptoms predated his awareness of his tachycardia by a couple of months. The fact that his atrial flutter terminated spontaneously however, makes me less sanguine that this is in fact the case.  The next issue is whether there is ischemic disease contributing to cardiomyopathy as suggested by his history of exertional chest discomfort as well as his T-wave abnormalities.  now that in NSR would cath first.  + tn raises possibility of myocarditis, so may want to consider advanced imaging.     My preference would be to defer catheter ablation until we see whether there is interval improvement in left ventricular function as this will decrease whatever procedural risks there are. In the event that he would have recurrent atrial flutter, as suggested by the fact that he terminated spontaneously, I would proceed with catheter ablation more expeditiously accepting the increased risks.  At that time,   EP study would also help us to further evaluate electrical substrate for possible WPW as suggested by the very short PR interval, although there are no delta waves.  Given his history of syncope he will need Lifevest protection for the next number of months until we see what his cardiomyopathy resolves with control of his heart rate.  I have reviewed the above with him.   Based on the above, I would therefore recommend 1-undertake catheterization 2-discontinue diltiazem and metoprolol and begin carvedilol and lisinopril anticipating the addition of BiDil 3-begin anticoagulation, and we'll use a NOAC following catheterization 4-anticipate reevaluation of left ventricular function in 4-5 weeks with anticipated EP study/catheter ablation at that time 5-anticipate discharge with a Lifevest- called 6-replete potassium.  Britani Beattie   

## 2013-09-02 NOTE — Progress Notes (Signed)
Instructed patient on viewing pre cardiac cath video, Berle Mull RN

## 2013-09-02 NOTE — Progress Notes (Signed)
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 The patient states that the shortness of breath started about 4-6 weeks ago mainly with exertion. He subsequently noted palpitations which started about 2 weeks ago. These were intermittent in nature and he suspected he was having panic attacks. He states that when he tried to fall asleep he would wake up short of breath. He tried nor these symptoms up until he noted his heart rate was greater than 150 when checked on a monitor in the pharmacy and he began to have symptoms of shortness of breath and palpitations again  Assessment/Plan: Active Problems:   Atrial flutter with rapid ventricular response -Currently on Cardizem infusion which will be transitioned to oral by cardiology today -Cardiology will arrange for novel anticoagulant samples for the patient -Plan is for an outpatient EP study and possible ablation as an outpatient after reassessing LV function  Near Syncope -Patient relates this to stress due to personal issues however, do to poor EF he will need to be discharged with a lot of rest  Acute systolic CHF (congestive heart failure), NYHA class 2 -Difficult to tell if this is secondary to uncontrolled heart rate versus other reasons -Will undergo cardiac cath tomorrow -EF will be reassessed with a TTE as an outpatient by cardiology -Discharge with life vest as mentioned above  Elevated troponin level -Cardiac cath planned for tomorrow    Tobacco abuse -Tonsil to discontinue       Cannabis abuse, continuous use -Counseled to discontinue    Transaminitis -Hepatitis  panel negative -Follow -Possibly do to hepatic congestion as a result of heart failure  Hypokalemia -Replace appropriately -Magnesium level is within normal limits   Code Status: Full code Family Communication: Family at bedside Disposition Plan: Follow in step down unit and eventually will go home  Consultants: Cardiology  Procedures: None  Antibiotics: None  DVT prophylaxis: Heparin  HPI/Subjective: Patient sitting up in bed. States he is not having any further palpitations but he did have an episode of "panic" a few minutes ago. He has a history of anxiety in the past but he's noticed that his anxiety attacks have recurred frequently over the past 2 weeks. In point of chest pain or cough. Has not ambulated and therefore is unable to tell if his dyspnea on exertion has improved.   Objective: Blood pressure 138/108, pulse 91, temperature 98.7 F (37.1 C), temperature source Oral, resp. rate 24, height 5\' 11"  (1.803 m), weight 89.3 kg (196 lb 13.9 oz), SpO2 94.00%.  Intake/Output Summary (Last 24 hours) at 09/02/13 1728 Last data filed at 09/02/13 1000  Gross per 24 hour  Intake 1350.75 ml  Output   2250 ml  Net -899.25 ml     Exam: General: No acute respiratory distress Lungs: Clear to auscultation bilaterally without wheezes or crackles Cardiovascular: Regular rate and rhythm - 2/6 murmur at apex Abdomen: Nontender, nondistended, soft, bowel sounds positive, no rebound, no ascites, no appreciable mass Extremities: No significant cyanosis, clubbing, or edema bilateral lower extremities  Data Reviewed: Basic Metabolic Panel:  Recent Labs Lab 09/01/13 0249 09/01/13 0742 09/01/13 2005 09/02/13 0028  NA 135  --  136  139  141  K 3.4*  --  3.4* 3.0*  3.1*  CL 96  --  97 99  100  CO2 24  --  26 27  27   GLUCOSE 125*  --  119* 90  90  BUN 17  --  21 22  22   CREATININE 1.19  --  1.40* 1.24  1.25  CALCIUM 8.8  --  8.6 8.4  8.3*  MG  --  1.9  --  1.9    Liver Function Tests:  Recent Labs Lab 09/01/13 0742 09/02/13 0028  AST 60* 44*  ALT 130* 107*  ALKPHOS 75 66  BILITOT 0.9 0.6  PROT 7.2 6.6  ALBUMIN 3.4* 3.2*   No results found for this basename: LIPASE, AMYLASE,  in the last 168 hours No results found for this basename: AMMONIA,  in the last 168 hours CBC:  Recent Labs Lab 09/01/13 0249 09/02/13 0028 09/02/13 0940  WBC 7.9 8.0 6.3  HGB 14.6 13.7 13.6  HCT 41.0 38.8* 38.6*  MCV 99.0 99.7 99.5  PLT 214 194 185   Cardiac Enzymes:  Recent Labs Lab 09/01/13 0531 09/01/13 0946 09/01/13 1640 09/01/13 2005  CKTOTAL 354* 374*  --   --   CKMB  --  7.8*  --   --   TROPONINI  --  0.37* 0.33* 0.34*   BNP (last 3 results)  Recent Labs  09/01/13 0249  PROBNP 5936.0*   CBG: No results found for this basename: GLUCAP,  in the last 168 hours  Recent Results (from the past 240 hour(s))  MRSA PCR SCREENING     Status: None   Collection Time    09/01/13  3:06 PM      Result Value Range Status   MRSA by PCR NEGATIVE  NEGATIVE Final   Comment:            The GeneXpert MRSA Assay (FDA     approved for NASAL specimens     only), is one component of a     comprehensive MRSA colonization     surveillance program. It is not     intended to diagnose MRSA     infection nor to guide or     monitor treatment for     MRSA infections.     Studies:  Recent x-ray studies have been reviewed in detail by the Attending Physician  Scheduled Meds:  Scheduled Meds: . [START ON 09/03/2013] aspirin  81 mg Oral Once  . carvedilol  6.25 mg Oral BID WC  . furosemide  40 mg Intravenous Q12H  . influenza vac split quadrivalent PF  0.5 mL Intramuscular Tomorrow-1000  . lisinopril  5 mg Oral Daily  . pneumococcal 23 valent vaccine  0.5 mL Intramuscular Tomorrow-1000  . [START ON 09/03/2013] potassium chloride  40 mEq Oral BID  . potassium chloride  40 mEq Oral Once  . potassium chloride  40 mEq Oral Q3H  . sodium chloride  3 mL  Intravenous Q12H  . sodium chloride  3 mL Intravenous Q12H  . sodium chloride  3 mL Intravenous Q12H   Continuous Infusions: . sodium chloride Stopped (09/02/13 0006)  . sodium chloride    . sodium chloride    . heparin 2,000 Units/hr (09/02/13 1318)    Time spent on care of this patient: 35 minutes   Calvert Cantor, MD  [facility] Office  [phone] Pager - Text Page per Loretha Stapler as per below:  On-Call/Text Page:  [email or web address]      password [email or web address]  If 7PM-7AM, please contact night-coverage [email or web address] Password [email or web address] 09/02/2013, 5:28 PM   LOS: 1 day

## 2013-09-02 NOTE — Progress Notes (Signed)
ANTICOAGULATION CONSULT NOTE - Follow Up Consult  Pharmacy Consult for Heparin Indication: aflutter  Allergies  Allergen Reactions  . Acetaminophen     "shaky" "makes me angry"  . Peanut Butter Flavor     Breathing problems, swells lips    Patient Measurements: Height: 5\' 11"  (180.3 cm) Weight: 196 lb 13.9 oz (89.3 kg) IBW/kg (Calculated) : 75.3 Heparin Dosing Weight: 89kg  Vital Signs: Temp: 98.4 F (36.9 C) (12/03 1117) Temp src: Oral (12/03 1117) BP: 125/79 mmHg (12/03 1117) Pulse Rate: 91 (12/03 1117)  Labs:  Recent Labs  09/01/13 0249 09/01/13 0531 09/01/13 0946 09/01/13 1640 09/01/13 2005 09/02/13 0028 09/02/13 0940  HGB 14.6  --   --   --   --  13.7 13.6  HCT 41.0  --   --   --   --  38.8* 38.6*  PLT 214  --   --   --   --  194 185  HEPARINUNFRC  --   --   --  <0.10*  --  0.24* 0.15*  CREATININE 1.19  --   --   --  1.40* 1.24  1.25  --   CKTOTAL  --  354* 374*  --   --   --   --   CKMB  --   --  7.8*  --   --   --   --   TROPONINI  --   --  0.37* 0.33* 0.34*  --   --     Estimated Creatinine Clearance: 72.5 ml/min (by C-G formula based on Cr of 1.24).   Medications:  Heparin @ 1650 units/hr  Assessment: 52yom continues on heparin for aflutter, though converted to NSR last evening. Troponins positive x2. Heparin level is subtherapeutic despite re-bolus and rate increase this morning. No issues with infusion. CBC remains stable. No bleeding reported.  Goal of Therapy:  Heparin level 0.3-0.7 units/ml Monitor platelets by anticoagulation protocol: Yes   Plan:  1) Re-bolus heparin 2500 units x 1 2) Increase heparin to 2000 units/hr 3) Check 6 hour heparin level  Fredrik Rigger 09/02/2013,12:52 PM

## 2013-09-02 NOTE — Progress Notes (Signed)
ANTICOAGULATION CONSULT NOTE - Follow Up Consult  Pharmacy Consult for Heparin Indication: aflutter  Allergies  Allergen Reactions  . Acetaminophen     "shaky" "makes me angry"  . Peanut Butter Flavor     Breathing problems, swells lips    Patient Measurements: Height: 5\' 11"  (180.3 cm) Weight: 196 lb 13.9 oz (89.3 kg) IBW/kg (Calculated) : 75.3 Heparin Dosing Weight: 89kg  Vital Signs: Temp: 98.7 F (37.1 C) (12/03 1612) Temp src: Oral (12/03 1612) BP: 138/108 mmHg (12/03 1612) Pulse Rate: 91 (12/03 1117)  Labs:  Recent Labs  09/01/13 0249 09/01/13 0531 09/01/13 0946  09/01/13 1640 09/01/13 2005 09/02/13 0028 09/02/13 0940 09/02/13 1854  HGB 14.6  --   --   --   --   --  13.7 13.6  --   HCT 41.0  --   --   --   --   --  38.8* 38.6*  --   PLT 214  --   --   --   --   --  194 185  --   HEPARINUNFRC  --   --   --   < > <0.10*  --  0.24* 0.15* 0.61  CREATININE 1.19  --   --   --   --  1.40* 1.24  1.25  --   --   CKTOTAL  --  354* 374*  --   --   --   --   --   --   CKMB  --   --  7.8*  --   --   --   --   --   --   TROPONINI  --   --  0.37*  --  0.33* 0.34*  --   --   --   < > = values in this interval not displayed.  Estimated Creatinine Clearance: 72.5 ml/min (by C-G formula based on Cr of 1.24).   Medications:  Heparin @ 2000 units/hr  Assessment: 52yom continues on heparin for aflutter- he has converted to NSR. He received a re-bolus and rate increase this afternoon. Re-check of heparin level is therapeutic at 0.61 units/mL. CBC remains stable. No bleeding noted.  Goal of Therapy:  Heparin level 0.3-0.7 units/ml Monitor platelets by anticoagulation protocol: Yes   Plan:  1. Continue heparin at 2000 units/hr 2. Daily heparin level and CBC 3. Follow for s/s bleeding and long term plans.  Yilin Weedon D. Estefania Kamiya, PharmD, BCPS Clinical Pharmacist Pager: [phone] 09/02/2013 8:02 PM

## 2013-09-03 ENCOUNTER — Encounter (HOSPITAL_COMMUNITY)
Admission: EM | Disposition: A | Discharge status: Home / Self Care | Payer: Self-pay | Source: Home / Self Care | Attending: Internal Medicine

## 2013-09-03 DIAGNOSIS — I2510 Atherosclerotic heart disease of native coronary artery without angina pectoris: Secondary | ICD-10-CM

## 2013-09-03 DIAGNOSIS — R7401 Elevation of levels of lactic acid dehydrogenase (LDH): Secondary | ICD-10-CM

## 2013-09-03 DIAGNOSIS — R7402 Elevation of levels of lactic acid dehydrogenase (LDH): Secondary | ICD-10-CM

## 2013-09-03 HISTORY — PX: LEFT HEART CATHETERIZATION WITH CORONARY ANGIOGRAM: SHX5451

## 2013-09-03 LAB — HEMOGLOBIN A1C: Hgb A1c MFr Bld: 5.7 % — ABNORMAL HIGH (ref <5.7)

## 2013-09-03 LAB — PROTIME-INR
INR: 1.15 (ref 0.00–1.49)
Prothrombin Time: 14.5 seconds (ref 11.6–15.2)

## 2013-09-03 LAB — BASIC METABOLIC PANEL
Calcium: 8.7 mg/dL (ref 8.4–10.5)
Chloride: 107 mEq/L (ref 96–112)
Creatinine, Ser: 1.12 mg/dL (ref 0.50–1.35)
GFR calc Af Amer: 84 mL/min — ABNORMAL LOW (ref >90)
GFR calc non Af Amer: 73 mL/min — ABNORMAL LOW (ref >90)

## 2013-09-03 SURGERY — LEFT HEART CATHETERIZATION WITH CORONARY ANGIOGRAM
Anesthesia: LOCAL

## 2013-09-03 MED ORDER — HEPARIN SODIUM (PORCINE) 1000 UNIT/ML IJ SOLN
INTRAMUSCULAR | Status: AC
  Filled 2013-09-03: qty 1

## 2013-09-03 MED ORDER — ACETAMINOPHEN 325 MG PO TABS: 650.0000 mg | ORAL_TABLET | ORAL | Status: DC | PRN

## 2013-09-03 MED ORDER — ONDANSETRON HCL 4 MG/2ML IJ SOLN: 4.0000 mg | Freq: Four times a day (QID) | INTRAMUSCULAR | Status: DC | PRN

## 2013-09-03 MED ORDER — NITROGLYCERIN 0.2 MG/ML ON CALL CATH LAB
INTRAVENOUS | Status: AC
  Filled 2013-09-03: qty 1

## 2013-09-03 MED ORDER — VERAPAMIL HCL 2.5 MG/ML IV SOLN
INTRAVENOUS | Status: AC
  Filled 2013-09-03: qty 2

## 2013-09-03 MED ORDER — FUROSEMIDE 40 MG PO TABS
40.0000 mg | ORAL_TABLET | Freq: Two times a day (BID) | ORAL | Status: DC
  Administered 2013-09-03 – 2013-09-05 (×5): 40 mg via ORAL
  Filled 2013-09-03 (×11): qty 1

## 2013-09-03 MED ORDER — MIDAZOLAM HCL 2 MG/2ML IJ SOLN
INTRAMUSCULAR | Status: AC
  Filled 2013-09-03: qty 2

## 2013-09-03 MED ORDER — LIDOCAINE HCL (PF) 1 % IJ SOLN
INTRAMUSCULAR | Status: AC
  Filled 2013-09-03: qty 30

## 2013-09-03 MED ORDER — HEPARIN (PORCINE) IN NACL 100-0.45 UNIT/ML-% IJ SOLN
2000.0000 [IU]/h | INTRAMUSCULAR | Status: AC
  Administered 2013-09-03 – 2013-09-04 (×2): 2000 [IU]/h via INTRAVENOUS
  Filled 2013-09-03 (×4): qty 250

## 2013-09-03 MED ORDER — FENTANYL CITRATE 0.05 MG/ML IJ SOLN
INTRAMUSCULAR | Status: AC
  Filled 2013-09-03: qty 2

## 2013-09-03 MED ORDER — HEPARIN (PORCINE) IN NACL 2-0.9 UNIT/ML-% IJ SOLN
INTRAMUSCULAR | Status: AC
  Filled 2013-09-03: qty 1000

## 2013-09-03 MED ORDER — SODIUM CHLORIDE 0.9 % IV SOLN: INTRAVENOUS | Status: AC

## 2013-09-03 NOTE — Progress Notes (Signed)
Patient Name: Justin Huerta Date of Encounter: 09/03/2013   Active Problems:   Atrial flutter with rapid ventricular response   Tobacco abuse   Acute systolic CHF (congestive heart failure), NYHA class 2   Cannabis abuse, continuous use   SUBJECTIVE  The patient states taht he feels significantly better since the admission.  He has not been ambulating, but does not feel SOB when he sits up in bed.   CURRENT MEDS . aspirin  324 mg Oral Once  . [facility] HOLD] carvedilol  6.25 mg Oral BID WC  . [facility] HOLD] furosemide  40 mg Intravenous Q12H  . [MAR HOLD] influenza vac split quadrivalent PF  0.5 mL Intramuscular Tomorrow-1000  . [MAR HOLD] lisinopril  5 mg Oral Daily  . [facility] HOLD] pneumococcal 23 valent vaccine  0.5 mL Intramuscular Tomorrow-1000  . [facility] HOLD] potassium chloride  40 mEq Oral BID  . [facility] HOLD] zolpidem  5 mg Oral Once    OBJECTIVE  Filed Vitals:   09/02/13 2325 09/03/13 0350 09/03/13 0830 09/03/13 0911  BP:  127/89 127/95   Pulse:  80 78 77  Temp:  97.8 F (36.6 C) 98.4 F (36.9 C)   TempSrc:  Oral Oral   Resp:  18 14   Height:      Weight:  193 lb 12.6 oz (87.9 kg)    SpO2: 100% 97% 95%     Intake/Output Summary (Last 24 hours) at 09/03/13 1108 Last data filed at 09/03/13 0800  Gross per 24 hour  Intake 1646.45 ml  Output   3625 ml  Net -1978.55 ml   Filed Weights   09/01/13 1523 09/02/13 0424 09/03/13 0350  Weight: 194 lb 7.1 oz (88.2 kg) 196 lb 13.9 oz (89.3 kg) 193 lb 12.6 oz (87.9 kg)    PHYSICAL EXAM  General: Pleasant, NAD. Looking much more comfortable today Neuro: Alert and oriented X 3. Moves all extremities spontaneously. Psych: Normal affect. HEENT:  Normal  Neck: Supple without bruits or No JVD.  Smooth, right sided thryroid hypertrophy  Lungs:  Resp regular and unlabored, very mild crackles at bases Heart: RRR no s3, s4, or murmurs. Abdomen: Soft, non-tender, non-distended Extremities: Pulses 2+ BL, chronic swelling and skin  changes in right leg 2/2 hamstring tear in late Sept. No pitting edema  Accessory Clinical Findings  Recent Labs  09/02/13 0028 09/02/13 0940  WBC 8.0 6.3  HGB 13.7 13.6  HCT 38.8* 38.6*  MCV 99.7 99.5  PLT 194 185   Basic Metabolic Panel  Recent Labs  09/01/13 0742  09/02/13 0028 09/03/13 0340  NA  --   < > 139  141 139  K  --   < > 3.0*  3.1* 4.6  CL  --   < > 99  100 107  CO2  --   < > 27  27 26   GLUCOSE  --   < > 90  90 96  BUN  --   < > 22  22 17   CREATININE  --   < > 1.24  1.25 1.12  CALCIUM  --   < > 8.4  8.3* 8.7  MG 1.9  --  1.9  --   < > = values in this interval not displayed. Liver Function Tests  Recent Labs  09/01/13 0742 09/02/13 0028  AST 60* 44*  ALT 130* 107*  ALKPHOS 75 66  BILITOT 0.9 0.6  PROT 7.2 6.6  ALBUMIN 3.4* 3.2*  Recent Labs  09/01/13 0531 09/01/13 0946 09/01/13 1640 09/01/13 2005  CKTOTAL 354* 374*  --   --   CKMB  --  7.8*  --   --   TROPONINI  --  0.37* 0.33* 0.34*   BNP No components found with this basename: POCBNP,  D-Dimer  Recent Labs  09/01/13 0309  DDIMER 1.65*   Hemoglobin A1C  Recent Labs  09/02/13 0940  HGBA1C 5.7*    Thyroid Function Tests  Recent Labs  09/01/13 0309 09/01/13 0742  TSH 0.818  --   T3FREE  --  3.4    TELE  HR: 75 bmp NSR  ECG 09/01/13 ~ 5pm Deep T wave inversion and ant  Radiology/Studies Ct Angio Chest Pe W/cm &/or Wo Cm  09/01/2013    IMPRESSION: Degraded by respiratory motion. Lower lobe segmental and more peripheral branches are nondiagnostic. Otherwise, no pulmonary embolism identified.  Dilatation of the main pulmonary artery can be seen in the setting of pulmonary arterial hypertension.  Cardiomegaly with small pleural effusions and a pulmonary edema pattern.    TTE 09/02/2013 - Left ventricle: The cavity size was moderately dilated. Wall thickness was increased in a pattern of mild LVH. Systolic function was moderately to severely reduced.  The estimated ejection fraction was in the range of 30% to 35%. Diffuse hypokinesis. Left ventricular diastolic function parameters were normal. - Aortic valve: Mild regurgitation. - Mitral valve: Mild regurgitation. - Left atrium: The atrium was mildly to moderately dilated. - Right atrium: The atrium was mildly dilated. - Pulmonary arteries: PA peak pressure: 45mm Hg (S).  Cardiac Cath Procedure Note:  Left main: Large vessel. 30% distal stenosis  LAD: Long vessel wraps apex. Gives off two diagonals. Proximal calcification. 40% proximal lesion. 40-50% smooth shelf-like lesion in midvessel. Small intramyocardial segment at apex.  LCX: Gives off large branching OM-1, Small OM-2 and OM-3. 40% lesion in mid AV -groove LCX. 30% lesion in proximal portion of upper branch of OM-1. 50% lesion in ostium of small OM-3.  RCA: Dominant vessel. Mild aneurysmal segment in midsection. Minimal plaque.  LV-gram done in the RAO projection: Ejection fraction = 20-25% global HK  Assessment:  1. Nonobstructive CAD  2. Nonischemic CM with severe LV dysfunction EF 20-25%  Plan/Discussion:  Proceed with medical therapy. Treatment of AFL per EP team.   Telemetry: a run of nsVT this am consisting of 7 beats    ASSESSMENT AND PLAN  Justin Huerta is a 52 y.o. male with a history of tobacco abuse, myoblastoma in remission and no past cardiac history who presented to the ED with atrial flutter with RVR, hypertension and florid congestive heart failure.   1. Atrial Flutter: patient came in with 2:1 A-V conduction (HR: 145) with stage IV heart failure, The patient cardioverted spontaneously at 5 am into SR with a very short PR. 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 EP (Dr Graciela Husbands) was consulted for a possible ablation and consideration of aberrancy. He still remains in SR. There was a run of nsVT this am consisting of 7 beats His cath showed nonobstructive CAD. We will contact EP for further management.  - On  heparin per pharmacy   2. Acute congestive heart failure- tachycardia induced, non-obstructive CAD on cath today.  Echocardiogram showed moderately dilated LV with LV EF 30-35% and moderately enlarged left atrium. This is suggestive of rather chronic than acute heart failure.  - BNP 5936, CXR/CT reveal cardiomegaly, pulm edema and bilateral pleural effusions  -  Elevated transaminases, normalizing with diuresis  - Marked symptomatic improvement on IV lasix:  net output -10L, down - 8lbs  - Continue IV Lasix 40 mg BID  3. Hypertension - BP normalized with current therapy  4. Hypokalemia - resolved, supplemented with Kdur 40 mg po bid   5. Thyroid enlargement - Thyroid studies normal, follow up as an outpatient   6. Tobacco Abuse - Patient reports that he quit last week  Tobias Alexander, Rexene Edison 09/03/2013

## 2013-09-03 NOTE — CV Procedure (Signed)
Cardiac Cath Procedure Note:  Indication: Unstable angina, atrial flutter, LV dysfunction by echo  Procedures performed:  1) Selective coronary angiography 2) Left heart catheterization 3) Left ventriculogram  Description of procedure:   The risks and indication of the procedure were explained. Consent was signed and placed on the chart. An appropriate timeout was taken prior to the procedure. After a normal Allen's test was confirmed, the right wrist was prepped and draped in the routine sterile fashion and anesthetized with 1% local lidocaine.   A 5 FR arterial sheath was then placed in the right radial artery using a modified Seldinger technique. Systemic heparin was administered. 3mg  IV verapamil was given through the sheath. Standard catheters including a JL 3.5, JR4 and straight pigtail were used. All catheter exchanges were made over a wire.  Complications:  None apparent  Contrast: 70 cc  Findings:  Ao Pressure: 101/82 (92) LV Pressure: 101/9/19 There was no signficant gradient across the aortic valve on pullback.  Left main: Large vessel. 30% distal stenosis  LAD: Long vessel wraps apex. Gives off two diagonals. Proximal calcification. 40% proximal lesion. 40-50% smooth shelf-like lesion in midvessel. Small intramyocardial segment at apex.    LCX: Gives off large branching OM-1, Small OM-2 and OM-3. 40% lesion in mid AV -groove LCX. 30% lesion in proximal portion of upper branch of OM-1. 50% lesion in ostium of small OM-3.   RCA: Dominant vessel. Mild aneurysmal segment in midsection. Minimal plaque.  LV-gram done in the RAO projection: Ejection fraction = 20-25% global HK  Assessment: 1. Nonobstructive CAD 2. Nonischemic CM with severe LV dysfunction EF 20-25%  Plan/Discussion:  Proceed with medical therapy. Treatment of AFL per EP team.  Arvilla Meres MD 9:58 AM

## 2013-09-03 NOTE — Progress Notes (Signed)
To the cath lab , stable. Heparin gtt d/ced. Valium on call given.

## 2013-09-03 NOTE — Interval H&P Note (Signed)
History and Physical Interval Note:  09/03/2013 9:33 AM  Justin Huerta  has presented today for surgery, with the diagnosis of heart failure abnormal ECGCath Lab Visit (complete for each Cath Lab visit)  Clinical Evaluation Leading to the Procedure:   ACS: yes  Non-ACS:    Anginal Classification: CCS IV  Anti-ischemic medical therapy: Minimal Therapy (1 class of medications)  Non-Invasive Test Results: No non-invasive testing performed  Prior CABG: No previous CABG       The various methods of treatment have been discussed with the patient and family. After consideration of risks, benefits and other options for treatment, the patient has consented to  Procedure(s): LEFT HEART CATHETERIZATION WITH CORONARY ANGIOGRAM (N/A) as a surgical intervention .  The patient's history has been reviewed, patient examined, no change in status, stable for surgery.  I have reviewed the patient's chart and labs.  Questions were answered to the patient's satisfaction.     Halla Chopp

## 2013-09-03 NOTE — Progress Notes (Addendum)
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 The patient states that the shortness of breath started about 4-6 weeks ago mainly with exertion. He subsequently noted palpitations which started about 2 weeks ago. These were intermittent in nature and he suspected he was having panic attacks. He states that when he tried to fall asleep he would wake up short of breath. He tried nor these symptoms up until he noted his heart rate was greater than 150 when checked on a monitor in the pharmacy and he began to have symptoms of shortness of breath and palpitations again  Assessment/Plan: Active Problems:   Atrial flutter with rapid ventricular response -has gone back in to a-flutter and has been started on Amio infusion per cardiology -also on oral coreg -Cardiology will arrange for novel anticoagulant samples for the patient -Plan is for an outpatient EP study and possible ablation as an outpatient after reassessing LV function  Near Syncope -Patient relates this to stress due to personal issues however, do to poor EF he will need to be discharged with a life vest  Acute systolic CHF (congestive heart failure), NYHA class 2 -Difficult to tell if this is secondary to uncontrolled heart rate versus other reasons -EF will be reassessed with a TTE as an outpatient by cardiology -Discharge with life vest as mentioned above  Elevated troponin level -Cardiac cath - cardiac cath reveals clean coronaries.     Tobacco abuse -Counseled  to discontinue    Cannabis abuse, continuous use -Counseled to discontinue    Transaminitis -Hepatitis  panel negative -Follow -Possibly due to hepatic congestion as a result of heart failure  Hypokalemia -Replaced appropriately -Magnesium level is within normal limits   Code Status: Full code Family Communication: Family at bedside on 12/4 Disposition Plan: Follow on telemetry- will go home when cleared by cardiology  Consultants: Cardiology  Procedures: None  Antibiotics: None  DVT prophylaxis: Heparin  HPI/Subjective: Patient alert- no palpitations or dyspnea    Objective: Blood pressure 126/95, pulse 71, temperature 97.7 F (36.5 C), temperature source Oral, resp. rate 16, height 5\' 11"  (1.803 m), weight 87.9 kg (193 lb 12.6 oz), SpO2 99.00%.  Intake/Output Summary (Last 24 hours) at 09/03/13 1702 Last data filed at 09/03/13 0800  Gross per 24 hour  Intake 1246.5 ml  Output   3325 ml  Net -2078.5 ml     Exam: General: No acute respiratory distress Lungs: Clear to auscultation bilaterally without wheezes or crackles Cardiovascular: Irregular rate and rhythm - HR 110-120s Abdomen: Nontender, nondistended, soft, bowel sounds positive, no rebound, no ascites, no appreciable mass Extremities: No significant cyanosis, clubbing, or edema bilateral lower extremities  Data Reviewed: Basic Metabolic Panel:  Recent Labs Lab 09/01/13 0249 09/01/13 0742 09/01/13 2005 09/02/13 0028 09/03/13 0340  NA 135  --  136 139  141 139  K 3.4*  --  3.4* 3.0*  3.1* 4.6  CL 96  --  97 99  100 107  CO2 24  --  26 27  27 26   GLUCOSE 125*  --  119* 90  90 96  BUN 17  --  21 22  22 17   CREATININE  1.19  --  1.40* 1.24  1.25 1.12  CALCIUM 8.8  --  8.6 8.4  8.3* 8.7  MG  --  1.9  --  1.9  --    Liver Function Tests:  Recent Labs Lab 09/01/13 0742 09/02/13 0028  AST 60* 44*  ALT 130* 107*  ALKPHOS 75 66  BILITOT 0.9 0.6  PROT 7.2 6.6  ALBUMIN 3.4* 3.2*   No results found for this basename: LIPASE, AMYLASE,  in the last 168 hours No results found for this  basename: AMMONIA,  in the last 168 hours CBC:  Recent Labs Lab 09/01/13 0249 09/02/13 0028 09/02/13 0940  WBC 7.9 8.0 6.3  HGB 14.6 13.7 13.6  HCT 41.0 38.8* 38.6*  MCV 99.0 99.7 99.5  PLT 214 194 185   Cardiac Enzymes:  Recent Labs Lab 09/01/13 0531 09/01/13 0946 09/01/13 1640 09/01/13 2005  CKTOTAL 354* 374*  --   --   CKMB  --  7.8*  --   --   TROPONINI  --  0.37* 0.33* 0.34*   BNP (last 3 results)  Recent Labs  09/01/13 0249  PROBNP 5936.0*   CBG: No results found for this basename: GLUCAP,  in the last 168 hours  Recent Results (from the past 240 hour(s))  MRSA PCR SCREENING     Status: None   Collection Time    09/01/13  3:06 PM      Result Value Range Status   MRSA by PCR NEGATIVE  NEGATIVE Final   Comment:            The GeneXpert MRSA Assay (FDA     approved for NASAL specimens     only), is one component of a     comprehensive MRSA colonization     surveillance program. It is not     intended to diagnose MRSA     infection nor to guide or     monitor treatment for     MRSA infections.     Studies:  Recent x-ray studies have been reviewed in detail by the Attending Physician  Scheduled Meds:  Scheduled Meds: . carvedilol  6.25 mg Oral BID WC  . furosemide  40 mg Oral BID  . influenza vac split quadrivalent PF  0.5 mL Intramuscular Tomorrow-1000  . lisinopril  5 mg Oral Daily  . pneumococcal 23 valent vaccine  0.5 mL Intramuscular Tomorrow-1000  . potassium chloride  40 mEq Oral BID  . zolpidem  5 mg Oral Once   Continuous Infusions: . heparin      Time spent on care of this patient: 35 minutes   Calvert Cantor, MD  [facility] Office  [phone] Pager - Text Page per Loretha Stapler as per below:  On-Call/Text Page:      [email or web address]      password [email or web address]  If 7PM-7AM, please contact night-coverage [email or web address] Password [email or web address] 09/03/2013, 5:02 PM   LOS: 2 days

## 2013-09-03 NOTE — Progress Notes (Signed)
ANTICOAGULATION CONSULT NOTE - Follow Up Consult  Pharmacy Consult for Heparin Indication: aflutter  Allergies  Allergen Reactions  . Acetaminophen     "shaky" "makes me angry"  . Peanut Butter Flavor     Breathing problems, swells lips    Patient Measurements: Height: 5\' 11"  (180.3 cm) Weight: 193 lb 12.6 oz (87.9 kg) IBW/kg (Calculated) : 75.3 Heparin Dosing Weight: 89kg  Vital Signs: Temp: 98.4 F (36.9 C) (12/04 0830) Temp src: Oral (12/04 0830) BP: 127/95 mmHg (12/04 0830) Pulse Rate: 78 (12/04 0830)  Labs:  Recent Labs  09/01/13 0249 09/01/13 0531 09/01/13 0946  09/01/13 1640 09/01/13 2005 09/02/13 0028 09/02/13 0940 09/02/13 1854 09/03/13 0340  HGB 14.6  --   --   --   --   --  13.7 13.6  --   --   HCT 41.0  --   --   --   --   --  38.8* 38.6*  --   --   PLT 214  --   --   --   --   --  194 185  --   --   LABPROT  --   --   --   --   --   --   --   --   --  14.5  INR  --   --   --   --   --   --   --   --   --  1.15  HEPARINUNFRC  --   --   --   < > <0.10*  --  0.24* 0.15* 0.61 0.26*  CREATININE 1.19  --   --   --   --  1.40* 1.24  1.25  --   --  1.12  CKTOTAL  --  354* 374*  --   --   --   --   --   --   --   CKMB  --   --  7.8*  --   --   --   --   --   --   --   TROPONINI  --   --  0.37*  --  0.33* 0.34*  --   --   --   --   < > = values in this interval not displayed.  Estimated Creatinine Clearance: 80.3 ml/min (by C-G formula based on Cr of 1.12).   Medications:  Heparin @ 2000 units/hr  Assessment: 52 year old male continues on heparin for aflutter. He has been converted to NSR. Heparin level decreased from 0.61 to 0.26. Nurse reported she was not aware of any problems or changes in heparin infusion. Heparin was stopped for catheterization today at 0900. Plan to start NOAC following catheterization per cardiologist. CBC remains stable. No bleeding noted.  Goal of Therapy:  Heparin level 0.3-0.7 units/ml Monitor platelets by anticoagulation  protocol: Yes   Plan: - Follow up restart of heparin after cath or start of NOAC - Monitor for s/s bleeding and long term plans  Matison Nuccio A. Lenon Ahmadi, PharmD Clinical Pharmacist - Resident Pager: [phone] Pharmacy: [phone] 09/03/2013 9:57 AM

## 2013-09-03 NOTE — Progress Notes (Signed)
Not coming back from the cath lab. Belongings endorsed to cath. Lab. Staff.

## 2013-09-03 NOTE — H&P (View-Only) (Signed)
ELECTROPHYSIOLOGY CONSULT NOTE  Patient ID: Justin Huerta, MRN: [id number], DOB/AGE: 54-19-1962 54 y.o. Admit date: 09/01/2013 Date of Consult: 09/02/2013  Primary Physician: No PCP Per Patient Primary Cardiologist: KN  Chief Complaint:  CHF Aflutter   HPI Justin Huerta is a 52 y.o. male  seen for atrial flutter.  The patient presented with a 3-to four-month history of progressive shortness of breath manifested by dyspnea on exertion notable the top of stairs. Over the last couple of weeks he has noted, in addition to the above, tachypalpitations which were provoked by exercise. Notably, in-hospital, when his atrial flutter rate was improved (?) w heart rate of 138, he was unaware of palpitations. Over the last couple of days prior to presentation he had increasing shortness of breath early satiety nocturnal dyspnea and orthopnea.  He was also noted exercise intolerance accompanied by chest discomfort which he describes as a pressure without radiation. This is the most notable in the last 3-4 months as well. Electrocardiogram on arrival notable for  deep T-wave inversions across the anterior precordium; he does have a history of hypertension. He converted spontaneously to sinus rhythm.  He has a history of syncope. Approximately 3 weeks ago he fell without warning. The episode was quite brief period he has also had various episodes of presyncope over the last couple of weeks accompanied by shortness of breath.  He has a history of myoblastoma which was treated with radiation therapy and gamma knife surgery but no chemotherapy            Past Medical History  Diagnosis Date  . Granular cell myoblastoma, malignant     a. myoblastoma  approx 1998, in remission. underwent gamma knife  procedure  . Atrial flutter with rapid ventricular response   . Tobacco abuse     16 pack history   . Congestive heart failure       Surgical History:  Past Surgical History  Procedure  Laterality Date  . Knee surgery    . Hiatal hernia repair    . Gamma knife surgery      myoblastoma ~1998     Home Meds: Prior to Admission medications   Medication Sig Start Date End Date Taking? Authorizing Provider  CINNAMON PO Take 1 capsule by mouth daily.   Yes Historical Provider, MD  Diphenhydramine-APAP, sleep, (EXCEDRIN PM PO) Take 1-2 tablets by mouth at bedtime as needed (sleep aid).   Yes Historical Provider, MD  simethicone (MYLICON) 80 MG chewable tablet Chew 80 mg by mouth once.   Yes Historical Provider, MD    Inpatient Medications:  . diltiazem  90 mg Oral Q6H  . furosemide  40 mg Intravenous Q12H  . influenza vac split quadrivalent PF  0.5 mL Intramuscular Tomorrow-1000  . metoprolol tartrate  25 mg Oral BID  . pneumococcal 23 valent vaccine  0.5 mL Intramuscular Tomorrow-1000  . [START ON 09/03/2013] potassium chloride  40 mEq Oral BID  . potassium chloride  40 mEq Oral Once  . sodium chloride  3 mL Intravenous Q12H  . sodium chloride  3 mL Intravenous Q12H  . sodium chloride  3 mL Intravenous Q12H    Allergies:  Allergies  Allergen Reactions  . Acetaminophen     "shaky" "makes me angry"  . Peanut Butter Flavor     Breathing problems, swells lips    History   Social History  . Marital Status: Single    Spouse Name: N/A    Number of Children: 2  .  Years of Education: N/A   Occupational History  . Sales     Self employed    Social History Main Topics  . Smoking status: Current Every Day Smoker -- 1.00 packs/day for 16 years    Types: Cigarettes  . Smokeless tobacco: Not on file  . Alcohol Use: Yes  . Drug Use: No     Comment: marajuana occasionally   . Sexual Activity: Yes   Other Topics Concern  . 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 He has 2 sons and lives with wife.      Family History  Problem Relation Age of Onset  . Heart attack Mother     alive      ROS:  Please see the history of present illness.     All other systems reviewed and negative.    Physical Exam: Blood pressure 125/79, pulse 91, temperature 98.4 F (36.9 C), temperature source Oral, resp. rate 23, height 5\' 11"  (1.803 m), weight 196 lb 13.9 oz (89.3 kg), SpO2 100.00%. General: Well developed, well nourished male in no acute distress. Head: Normocephalic, atraumatic, sclera non-icteric, no xanthomas, nares are without discharge. EENT: normal Lymph Nodes:  none Back: without scoliosis/kyphosis , no CVA tendersness Neck: Negative for carotid bruits. JVD 7 with HJR Lungs: Clear bilaterally to auscultation without wheezes, rales, or rhonchi. Breathing is unlabored. Heart: RRR with S1 S2.  2/6 systolic  murmur , rubs, or gallops appreciated. Abdomen: Soft, non-tender, non-distended with normoactive bowel sounds. No hepatomegaly. No rebound/guarding. No obvious abdominal masses. Msk:  Strength and tone appear normal for age. Extremities: No clubbing or cyanosis. No  edema.  Distal pedal pulses are 2+ and equal bilaterally. Skin: Warm and Dry Neuro: Alert and oriented X 3. CN III-XII intact Grossly normal sensory and motor function . Psych:  Responds to questions appropriately with a normal affect.      Labs: Cardiac Enzymes  Recent Labs  09/01/13 0531 09/01/13 0946 09/01/13 1640 09/01/13 2005  CKTOTAL 354* 374*  --   --   CKMB  --  7.8*  --   --   TROPONINI  --  0.37* 0.33* 0.34*   CBC Lab Results  Component Value Date   WBC 6.3 09/02/2013   HGB 13.6 09/02/2013   HCT 38.6* 09/02/2013   MCV 99.5 09/02/2013   PLT 185 09/02/2013   PROTIME: No results found for this basename: LABPROT, INR,  in the last 72 hours Chemistry  Recent Labs Lab 09/02/13 0028  NA 139  141  K 3.0*  3.1*  CL 99  100  CO2 27  27  BUN 22  22  CREATININE 1.24  1.25  CALCIUM 8.4  8.3*  PROT 6.6  BILITOT 0.6  ALKPHOS 66  ALT 107*  AST 44*  GLUCOSE 90  90   Lipids Lab  Results  Component Value Date   CHOL 163 09/02/2013   HDL 35* 09/02/2013   LDLCALC 111* 09/02/2013   TRIG 86 09/02/2013   BNP Pro B Natriuretic peptide (BNP)  Date/Time Value Range Status  09/01/2013  2:49 AM 5936.0* 0 - 125 pg/mL Final   Miscellaneous Lab Results  Component Value Date   DDIMER 1.65* 09/01/2013    Radiology/Studies:  Ct Angio Chest Pe W/cm &/or Wo Cm  09/01/2013   CLINICAL DATA:  Mid chest pressure/tightness  EXAM: CT ANGIOGRAPHY CHEST WITH CONTRAST  TECHNIQUE: Multidetector CT imaging  of the chest was performed using the standard protocol during bolus administration of intravenous contrast. Multiplanar CT image reconstructions including MIPs were obtained to evaluate the vascular anatomy.  CONTRAST:  OMNIPAQUE IOHEXOL 350 MG/ML SOLN  COMPARISON:  None.  FINDINGS: Lower lobe segmental pulmonary arterial branches are nondiagnostic due to respiratory motion. Within this limitation, no pulmonary embolism identified elsewhere. The main pulmonary artery dilates up to 3.8 cm.  Scattered atherosclerosis of the aorta and branch vessels. Mild focal outpouching along the arch, with the aorta measuring up to 3.4 cm. Trace pericardial fluid. Cardiomegaly. Coronary artery calcification. Small right and trace left pleural effusions.  Central airways are patent. Peribronchial thickening. Lower lobe interlobular septal thickening. Dependent and/or compressive lower lobe atelectasis.  Limited upper abdominal images show a few hypodensities within the left hepatic lobe, favored reflects cyst. Partially imaged cyst arising from the upper pole left kidney measures water attenuation. Mildly prominent mediastinal lymph nodes measuring up to 1 cm short axis, likely reactive.  No acute osseous finding.  Review of the MIP images confirms the above findings.  IMPRESSION: Degraded by respiratory motion. Lower lobe segmental and more peripheral branches are nondiagnostic. Otherwise, no pulmonary embolism  identified.  Dilatation of the main pulmonary artery can be seen in the setting of pulmonary arterial hypertension.  Cardiomegaly with small pleural effusions and a pulmonary edema pattern.   Electronically Signed   By: Jearld Lesch M.D.   On: 09/01/2013 04:53   Dg Chest Port 1 View  09/01/2013   CLINICAL DATA:  Chest pain, tightness, shortness of breath  EXAM: PORTABLE CHEST - 1 VIEW  COMPARISON:  09/01/2013 chest CT  FINDINGS: Cardiomegaly. Central pulmonary vascular prominence. Interstitial prominence Probable small effusions. Mild bibasilar atelectasis. No pneumothorax. No acute osseous finding.  IMPRESSION: Prominent cardiomediastinal contours. Interstitial prominence may reflect interstitial edema.  Trace effusions and mild lung base opacities; atelectasis versus infiltrate.   Electronically Signed   By: Jearld Lesch M.D.   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  The fact that the patient was unaware of his tachycardia when in atrial flutter races the hope that his cardiomyopathy is tachycardia-induced, and not withstanding the fact that his heart failure symptoms predated his awareness of his tachycardia by a couple of months. The fact that his atrial flutter terminated spontaneously however, makes me less sanguine that this is in fact the case.  The next issue is whether there is ischemic disease contributing to cardiomyopathy as suggested by his history of exertional chest discomfort as well as his T-wave abnormalities.  now that in NSR would cath first.  + tn raises possibility of myocarditis, so may want to consider advanced imaging.     My preference would be to defer catheter ablation until we see whether there is interval improvement in left ventricular function as this will decrease whatever procedural risks there are. In the event that he would have recurrent atrial flutter, as suggested by the fact that he terminated spontaneously, I would proceed with catheter ablation more expeditiously accepting the increased risks.  At that time,  EP study would also help [place] to further evaluate electrical substrate for possible WPW as suggested by the very short PR interval, although there are no delta waves.  Given his history of syncope he will need Lifevest protection for the next number of months until we see what his cardiomyopathy resolves with control of his heart rate.  I have reviewed the above with him.   Based on the above, I would therefore recommend 1-undertake catheterization 2-discontinue diltiazem and metoprolol and begin carvedilol and lisinopril anticipating the addition of BiDil 3-begin anticoagulation, and we'll use a NOAC following catheterization 4-anticipate reevaluation of left ventricular function in 4-5 weeks with anticipated EP study/catheter ablation at that time 5-anticipate discharge with a Lifevest- called 6-replete potassium.  Sherryl Manges

## 2013-09-03 NOTE — Progress Notes (Addendum)
ANTICOAGULATION CONSULT NOTE - Follow up  Pharmacy Consult for heparin Indication: aflutter  Allergies  Allergen Reactions  . Acetaminophen     "shaky" "makes me angry"  . Peanut Butter Flavor     Breathing problems, swells lips    Patient Measurements: Height: 5\' 11"  (180.3 cm) Weight: 193 lb 12.6 oz (87.9 kg) IBW/kg (Calculated) : 75.3 Heparin Dosing Weight: 89 kg  Vital Signs: Temp: 97.7 F (36.5 C) (12/04 1154) Temp src: Oral (12/04 1154) BP: 125/90 mmHg (12/04 1154) Pulse Rate: 71 (12/04 1154)  Labs:  Recent Labs  09/01/13 0249 09/01/13 0531 09/01/13 0946  09/01/13 1640 09/01/13 2005 09/02/13 0028 09/02/13 0940 09/02/13 1854 09/03/13 0340  HGB 14.6  --   --   --   --   --  13.7 13.6  --   --   HCT 41.0  --   --   --   --   --  38.8* 38.6*  --   --   PLT 214  --   --   --   --   --  194 185  --   --   LABPROT  --   --   --   --   --   --   --   --   --  14.5  INR  --   --   --   --   --   --   --   --   --  1.15  HEPARINUNFRC  --   --   --   < > <0.10*  --  0.24* 0.15* 0.61 0.26*  CREATININE 1.19  --   --   --   --  1.40* 1.24  1.25  --   --  1.12  CKTOTAL  --  354* 374*  --   --   --   --   --   --   --   CKMB  --   --  7.8*  --   --   --   --   --   --   --   TROPONINI  --   --  0.37*  --  0.33* 0.34*  --   --   --   --   < > = values in this interval not displayed.  Estimated Creatinine Clearance: 80.3 ml/min (by C-G formula based on Cr of 1.12).   Medical History: Past Medical History  Diagnosis Date  . Granular cell myoblastoma, malignant     a. myoblastoma  approx 1998, in remission. underwent gamma knife  procedure  . Atrial flutter with rapid ventricular response   . Tobacco abuse     16 pack history   . Congestive heart failure     Medications:  Prescriptions prior to admission  Medication Sig Dispense Refill  . CINNAMON PO Take 1 capsule by mouth daily.      . Diphenhydramine-APAP, sleep, (EXCEDRIN PM PO) Take 1-2 tablets by mouth at  bedtime as needed (sleep aid).      . simethicone (MYLICON) 80 MG chewable tablet Chew 80 mg by mouth once.        Assessment: 52 year old male continues on heparin for aflutter. He has been converted to NSR. Heparin was stopped for catheterization today at 0900. CBC remains stable. No s/sx of bleeding.  Goal of Therapy:  Heparin level 0.3-0.7 units/ml Monitor platelets by anticoagulation protocol: Yes   Plan:  Will resume pt on dose prior to  cath No bolus Heparin 2000 units/hour beginning at 1800 Heparin level in 6 hours  Daily CBC Daily heparin level  Agapito Games, PharmD, BCPS Clinical Pharmacist 09/03/2013 12:49 PM

## 2013-09-04 ENCOUNTER — Other Ambulatory Visit: Payer: Self-pay

## 2013-09-04 DIAGNOSIS — I4891 Unspecified atrial fibrillation: Secondary | ICD-10-CM

## 2013-09-04 LAB — CBC
HCT: 40.6 % (ref 39.0–52.0)
Hemoglobin: 14.1 g/dL (ref 13.0–17.0)
MCH: 35.1 pg — ABNORMAL HIGH (ref 26.0–34.0)
MCHC: 34.7 g/dL (ref 30.0–36.0)
MCV: 101 fL — ABNORMAL HIGH (ref 78.0–100.0)
Platelets: 213 10*3/uL (ref 150–400)
RBC: 4.02 MIL/uL — ABNORMAL LOW (ref 4.22–5.81)
WBC: 6 10*3/uL (ref 4.0–10.5)

## 2013-09-04 MED ORDER — AMIODARONE HCL IN DEXTROSE 360-4.14 MG/200ML-% IV SOLN
60.0000 mg/h | INTRAVENOUS | Status: AC
  Administered 2013-09-04 (×2): 60 mg/h via INTRAVENOUS
  Filled 2013-09-04 (×2): qty 200

## 2013-09-04 MED ORDER — RIVAROXABAN 20 MG PO TABS
20.0000 mg | ORAL_TABLET | Freq: Every day | ORAL | Status: DC
  Administered 2013-09-04 – 2013-09-05 (×2): 20 mg via ORAL
  Filled 2013-09-04 (×3): qty 1

## 2013-09-04 MED ORDER — AMIODARONE LOAD VIA INFUSION
150.0000 mg | Freq: Once | INTRAVENOUS | Status: AC
  Administered 2013-09-04: 150 mg via INTRAVENOUS
  Filled 2013-09-04: qty 83.34

## 2013-09-04 MED ORDER — AMIODARONE HCL IN DEXTROSE 360-4.14 MG/200ML-% IV SOLN
30.0000 mg/h | INTRAVENOUS | Status: DC
  Administered 2013-09-04: 30 mg/h via INTRAVENOUS
  Filled 2013-09-04 (×5): qty 200

## 2013-09-04 MED ORDER — METOPROLOL TARTRATE 1 MG/ML IV SOLN: 5.0000 mg | Freq: Once | INTRAVENOUS | Status: DC

## 2013-09-04 MED ORDER — CARVEDILOL 12.5 MG PO TABS
12.5000 mg | ORAL_TABLET | Freq: Two times a day (BID) | ORAL | Status: DC
  Administered 2013-09-04 – 2013-09-06 (×4): 12.5 mg via ORAL
  Filled 2013-09-04 (×6): qty 1

## 2013-09-04 MED ORDER — OFF THE BEAT BOOK
Freq: Once | Status: AC
  Administered 2013-09-04: 22:00:00
  Filled 2013-09-04: qty 1

## 2013-09-04 NOTE — Progress Notes (Signed)
ANTICOAGULATION CONSULT NOTE - Follow Up Consult  Pharmacy Consult for Heparin --> Xarelto Indication: atrial fibrillation  Allergies  Allergen Reactions  . Acetaminophen     "shaky" "makes me angry"  . Peanut Butter Flavor     Breathing problems, swells lips    Patient Measurements: Height: 5\' 11"  (180.3 cm) Weight: 193 lb 12.6 oz (87.9 kg) IBW/kg (Calculated) : 75.3  Vital Signs: Temp: 97.9 F (36.6 C) (12/05 1213) Temp src: Oral (12/05 1213) BP: 101/75 mmHg (12/05 1635) Pulse Rate: 71 (12/05 1635)  Labs:  Recent Labs  09/01/13 2005  09/02/13 0028 09/02/13 0940 09/02/13 1854 09/03/13 0340 09/04/13 09/04/13 0425  HGB  --   < > 13.7 13.6  --   --   --  14.1  HCT  --   --  38.8* 38.6*  --   --   --  40.6  PLT  --   --  194 185  --   --   --  213  LABPROT  --   --   --   --   --  14.5  --   --   INR  --   --   --   --   --  1.15  --   --   HEPARINUNFRC  --   < > 0.24* 0.15* 0.61 0.26* 0.39 0.63  CREATININE 1.40*  --  1.24  1.25  --   --  1.12  --   --   TROPONINI 0.34*  --   --   --   --   --   --   --   < > = values in this interval not displayed.  Estimated Creatinine Clearance: 80.3 ml/min (by C-G formula based on Cr of 1.12).   Medications:  Heparin 2000 units/hr  Assessment: 52yom on heparin for Afib. Pharmacy has been consulted to transition patient to Xarelto. - H/H and Plts wnl - Crcl 80 ml/min - LFTs slightly elevated on admit (60/130) but have trended down - No significant bleeding reported   Plan:  1. Xarelto 20mg  PO daily with supper - first dose tonight 2. Discontinue heparin drip at time of Xarelto administration 3. Discontinue heparin labs 4. Monitor renal function, LFTs, s/sx of bleeding  Cleon Dew [phone] 09/04/2013,5:16 PM

## 2013-09-04 NOTE — Progress Notes (Signed)
ANTICOAGULATION CONSULT NOTE - Follow Up Consult  Pharmacy Consult for heparin Indication: Aflutter  Labs:  Recent Labs  09/01/13 0249 09/01/13 0531 09/01/13 0946  09/01/13 1640 09/01/13 2005 09/02/13 0028 09/02/13 0940 09/02/13 1854 09/03/13 0340 09/04/13  HGB 14.6  --   --   --   --   --  13.7 13.6  --   --   --   HCT 41.0  --   --   --   --   --  38.8* 38.6*  --   --   --   PLT 214  --   --   --   --   --  194 185  --   --   --   LABPROT  --   --   --   --   --   --   --   --   --  14.5  --   INR  --   --   --   --   --   --   --   --   --  1.15  --   HEPARINUNFRC  --   --   --   < > <0.10*  --  0.24* 0.15* 0.61 0.26* 0.39  CREATININE 1.19  --   --   --   --  1.40* 1.24  1.25  --   --  1.12  --   CKTOTAL  --  354* 374*  --   --   --   --   --   --   --   --   CKMB  --   --  7.8*  --   --   --   --   --   --   --   --   TROPONINI  --   --  0.37*  --  0.33* 0.34*  --   --   --   --   --   < > = values in this interval not displayed.   Assessment/Plan:  52yo male now therapeutic on heparin after restarted post-cath.  Will continue gtt and confirm stable with am labs.  Vernard Gambles, PharmD, BCPS  09/04/2013,12:51 AM

## 2013-09-04 NOTE — Progress Notes (Signed)
ANTICOAGULATION CONSULT NOTE - Follow up  Pharmacy Consult for heparin Indication: aflutter  Allergies  Allergen Reactions  . Acetaminophen     "shaky" "makes me angry"  . Peanut Butter Flavor     Breathing problems, swells lips    Patient Measurements: Height: 5\' 11"  (180.3 cm) Weight: 193 lb 12.6 oz (87.9 kg) IBW/kg (Calculated) : 75.3 Heparin Dosing Weight: 89 kg  Vital Signs: Temp: 97.4 F (36.3 C) (12/05 0800) Temp src: Oral (12/05 0800) BP: 150/112 mmHg (12/05 0900) Pulse Rate: 130 (12/05 0900)  Labs:  Recent Labs  09/01/13 1640 09/01/13 2005  09/02/13 0028 09/02/13 0940 09/02/13 1854 09/03/13 0340 09/04/13 09/04/13 0425  HGB  --   --   < > 13.7 13.6  --   --   --  14.1  HCT  --   --   --  38.8* 38.6*  --   --   --  40.6  PLT  --   --   --  194 185  --   --   --  213  LABPROT  --   --   --   --   --   --  14.5  --   --   INR  --   --   --   --   --   --  1.15  --   --   HEPARINUNFRC <0.10*  --   --  0.24* 0.15* 0.61 0.26* 0.39 0.63  CREATININE  --  1.40*  --  1.24  1.25  --   --  1.12  --   --   TROPONINI 0.33* 0.34*  --   --   --   --   --   --   --   < > = values in this interval not displayed.  Estimated Creatinine Clearance: 80.3 ml/min (by C-G formula based on Cr of 1.12).   Medical History: Past Medical History  Diagnosis Date  . Granular cell myoblastoma, malignant     a. myoblastoma  approx 1998, in remission. underwent gamma knife  procedure  . Atrial flutter with rapid ventricular response   . Tobacco abuse     16 pack history   . Congestive heart failure     Medications:  Prescriptions prior to admission  Medication Sig Dispense Refill  . CINNAMON PO Take 1 capsule by mouth daily.      . Diphenhydramine-APAP, sleep, (EXCEDRIN PM PO) Take 1-2 tablets by mouth at bedtime as needed (sleep aid).      . simethicone (MYLICON) 80 MG chewable tablet Chew 80 mg by mouth once.        Assessment: 52 year old male continues on heparin for  aflutter. He has been converted to NSR. Heparin was resumed following catheterization 12/4 at 1800. CBC remains stable. No s/sx of bleeding.  Goal of Therapy:  Heparin level 0.3-0.7 units/ml Monitor platelets by anticoagulation protocol: Yes   Plan:  Continue heparin 2000 units/hour Daily CBC Daily heparin level  Agapito Games, PharmD, BCPS Clinical Pharmacist 09/04/2013 11:58 AM

## 2013-09-04 NOTE — Progress Notes (Signed)
Patient Name: Justin Huerta Date of Encounter: 09/04/2013  Active Problems:   Atrial flutter with rapid ventricular response   Tobacco abuse   Acute systolic CHF (congestive heart failure), NYHA class 2   Cannabis abuse, continuous use   SUBJECTIVE  The patient went into atrial fibrillation with RVR (HR 140 BPM) and feels mild SOB.   CURRENT MEDS . carvedilol  6.25 mg Oral BID WC  . furosemide  40 mg Oral BID  . influenza vac split quadrivalent PF  0.5 mL Intramuscular Tomorrow-1000  . lisinopril  5 mg Oral Daily  . pneumococcal 23 valent vaccine  0.5 mL Intramuscular Tomorrow-1000  . potassium chloride  40 mEq Oral BID  . zolpidem  5 mg Oral Once    OBJECTIVE  Filed Vitals:   09/04/13 0400 09/04/13 0800 09/04/13 0900 09/04/13 1213  BP: 145/97 151/121 150/112 144/94  Pulse: 76 57 130 117  Temp: 97.9 F (36.6 C) 97.4 F (36.3 C)  97.9 F (36.6 C)  TempSrc: Oral Oral  Oral  Resp:  20 22 20   Height:      Weight: 193 lb 12.6 oz (87.9 kg)     SpO2: 95% 96% 96% 100%    Intake/Output Summary (Last 24 hours) at 09/04/13 1335 Last data filed at 09/04/13 0730  Gross per 24 hour  Intake    240 ml  Output    600 ml  Net   -360 ml   Filed Weights   09/02/13 0424 09/03/13 0350 09/04/13 0400  Weight: 196 lb 13.9 oz (89.3 kg) 193 lb 12.6 oz (87.9 kg) 193 lb 12.6 oz (87.9 kg)    PHYSICAL EXAM  General: Pleasant, NAD. Looking much more comfortable today Neuro: Alert and oriented X 3. Moves all extremities spontaneously. Psych: Normal affect. HEENT:  Normal  Neck: Supple without bruits or No JVD.  Smooth, right sided thryroid hypertrophy  Lungs:  Resp regular and unlabored, very mild crackles at bases Heart: RRR no s3, s4, or murmurs. Abdomen: Soft, non-tender, non-distended Extremities: Pulses 2+ BL, chronic swelling and skin changes in right leg 2/2 hamstring tear in late Sept. No pitting edema  Accessory Clinical Findings  Recent Labs  09/02/13 0940  09/04/13 0425  WBC 6.3 6.0  HGB 13.6 14.1  HCT 38.6* 40.6  MCV 99.5 101.0*  PLT 185 213   Basic Metabolic Panel  Recent Labs  09/02/13 0028 09/03/13 0340  NA 139  141 139  K 3.0*  3.1* 4.6  CL 99  100 107  CO2 27  27 26   GLUCOSE 90  90 96  BUN 22  22 17   CREATININE 1.24  1.25 1.12  CALCIUM 8.4  8.3* 8.7  MG 1.9  --    Liver Function Tests  Recent Labs  09/02/13 0028  AST 44*  ALT 107*  ALKPHOS 66  BILITOT 0.6  PROT 6.6  ALBUMIN 3.2*    Recent Labs  09/01/13 1640 09/01/13 2005  TROPONINI 0.33* 0.34*   BNP No components found with this basename: POCBNP,  D-Dimer No results found for this basename: DDIMER,  in the last 72 hours Hemoglobin A1C  Recent Labs  09/02/13 0940  HGBA1C 5.7*    Thyroid Function Tests No results found for this basename: TSH, T4TOTAL, FREET3, T3FREE, THYROIDAB,  in the last 72 hours  TELE  HR: 75 bmp NSR  ECG 09/01/13 ~ 5pm Deep T wave inversion and ant  Radiology/Studies Ct Angio Chest Pe W/cm &/or Wo Cm  09/01/2013    IMPRESSION: Degraded by respiratory motion. Lower lobe segmental and more peripheral branches are nondiagnostic. Otherwise, no pulmonary embolism identified.  Dilatation of the main pulmonary artery can be seen in the setting of pulmonary arterial hypertension.  Cardiomegaly with small pleural effusions and a pulmonary edema pattern.    TTE 09/02/2013 - Left ventricle: The cavity size was moderately dilated. Wall thickness was increased in a pattern of mild LVH. Systolic function was moderately to severely reduced. The estimated ejection fraction was in the range of 30% to 35%. Diffuse hypokinesis. Left ventricular diastolic function parameters were normal. - Aortic valve: Mild regurgitation. - Mitral valve: Mild regurgitation. - Left atrium: The atrium was mildly to moderately dilated. - Right atrium: The atrium was mildly dilated. - Pulmonary arteries: PA peak pressure: 45mm Hg  (S).  Cardiac Cath Procedure Note:  Left main: Large vessel. 30% distal stenosis  LAD: Long vessel wraps apex. Gives off two diagonals. Proximal calcification. 40% proximal lesion. 40-50% smooth shelf-like lesion in midvessel. Small intramyocardial segment at apex.  LCX: Gives off large branching OM-1, Small OM-2 and OM-3. 40% lesion in mid AV -groove LCX. 30% lesion in proximal portion of upper branch of OM-1. 50% lesion in ostium of small OM-3.  RCA: Dominant vessel. Mild aneurysmal segment in midsection. Minimal plaque.  LV-gram done in the RAO projection: Ejection fraction = 20-25% global HK  Assessment:  1. Nonobstructive CAD  2. Nonischemic CM with severe LV dysfunction EF 20-25%  Plan/Discussion:  Proceed with medical therapy. Treatment of AFL per EP team.   Telemetry: a run of nsVT this am consisting of 7 beats  Cardiac cath 09/03/2013 Left main: Large vessel. 30% distal stenosis  LAD: Long vessel wraps apex. Gives off two diagonals. Proximal calcification. 40% proximal lesion. 40-50% smooth shelf-like lesion in midvessel. Small intramyocardial segment at apex.  LCX: Gives off large branching OM-1, Small OM-2 and OM-3. 40% lesion in mid AV -groove LCX. 30% lesion in proximal portion of upper branch of OM-1. 50% lesion in ostium of small OM-3.  RCA: Dominant vessel. Mild aneurysmal segment in midsection. Minimal plaque.  LV-gram done in the RAO projection: Ejection fraction = 20-25% global HK  Assessment:  1. Nonobstructive CAD  2. Nonischemic CM with severe LV dysfunction EF 20-25%  Plan/Discussion:  Proceed with medical therapy. Treatment of AFL per EP team.   ASSESSMENT AND PLAN   Justin Huerta is a 52 y.o. male with a history of tobacco abuse, myoblastoma in remission and no past cardiac history who presented to the ED with atrial flutter with RVR, hypertension and florid congestive heart failure.   1. Atrial Flutter/atrial fibrillation: patient came in with a-flutter  with 2:1 A-V conduction (HR: 145) with stage IV heart failure, The patient cardioverted spontaneously into SR with a very short PR. 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 EP (Dr Graciela Husbands) was consulted for a possible a flutter ablation and consideration of aberrancy. His cardiac catheterization yesterday showed non-obstructive CAD. The plan by EP was to wait until his LV EF recovers (assuming this is tach-induced CMP) and perform EP study for a-flutter ablation and evaluation for possible WPW. This morning he went into rapid atrial fibrillation. He was restarted on Amiodarone, his carvedilol is uptitrated. On iv heparin drip.  2. Acute congestive heart failure -possibly tachycardia induced, non-obstructive CAD on cath.  Echocardiogram showed moderately dilated LV with LV EF 30-35% and moderately enlarged left atrium. This is suggestive of rather chronic than acute heart  failure.  - BNP 5936, CXR/CT reveal cardiomegaly, pulm edema and bilateral pleural effusions on admission, now improved - Elevated transaminases, normalizing with diuresis  - Marked symptomatic improvement on IV lasix:  net output -10L, down - 8lbs  - Continue IV Lasix 40 mg BID  3. Hypertension - BP normalized with current therapy  4. Hypokalemia - resolved, supplemented with Kdur 40 mg po bid   5. Thyroid enlargement - Thyroid studies normal, follow up as an outpatient   6. Tobacco Abuse - Patient reports that he quit last week  Justin Huerta, Justin Huerta 09/04/2013

## 2013-09-04 NOTE — Progress Notes (Signed)
Utilization review completed.  

## 2013-09-04 NOTE — Progress Notes (Signed)
Patient is now rapid atrial flutter in 140's.  BP 150/112.  Dr. Delton See paged and notified.  Orders received. Will continue to monitor.  Colman Cater

## 2013-09-05 ENCOUNTER — Other Ambulatory Visit: Payer: Self-pay

## 2013-09-05 DIAGNOSIS — I4891 Unspecified atrial fibrillation: Secondary | ICD-10-CM

## 2013-09-05 LAB — CBC
HCT: 41.1 % (ref 39.0–52.0)
MCH: 35 pg — ABNORMAL HIGH (ref 26.0–34.0)
MCV: 100 fL (ref 78.0–100.0)
Platelets: 218 10*3/uL (ref 150–400)
RBC: 4.11 MIL/uL — ABNORMAL LOW (ref 4.22–5.81)
RDW: 12.5 % (ref 11.5–15.5)
WBC: 5.6 10*3/uL (ref 4.0–10.5)

## 2013-09-05 LAB — BASIC METABOLIC PANEL
BUN: 20 mg/dL (ref 6–23)
CO2: 29 mEq/L (ref 19–32)
Calcium: 9.7 mg/dL (ref 8.4–10.5)
Chloride: 97 mEq/L (ref 96–112)
Creatinine, Ser: 1.73 mg/dL — ABNORMAL HIGH (ref 0.50–1.35)
Sodium: 136 mEq/L (ref 135–145)

## 2013-09-05 MED ORDER — AMIODARONE HCL 200 MG PO TABS
200.0000 mg | ORAL_TABLET | Freq: Two times a day (BID) | ORAL | Status: DC
  Administered 2013-09-05 – 2013-09-06 (×3): 200 mg via ORAL
  Filled 2013-09-05 (×4): qty 1

## 2013-09-05 MED ORDER — ZOLPIDEM TARTRATE 5 MG PO TABS
5.0000 mg | ORAL_TABLET | Freq: Every evening | ORAL | Status: DC | PRN
  Administered 2013-09-05: 5 mg via ORAL
  Filled 2013-09-05: qty 1

## 2013-09-05 NOTE — Progress Notes (Signed)
Patient Name: Justin Huerta Date of Encounter: 09/05/2013  Active Problems:   Atrial flutter with rapid ventricular response   Tobacco abuse   Acute systolic CHF (congestive heart failure), NYHA class 2   Cannabis abuse, continuous use   Atrial fibrillation   SUBJECTIVE  The patient went into atrial fibrillation with RVR (HR 140 BPM) and feels mild SOB.   CURRENT MEDS . carvedilol  12.5 mg Oral BID WC  . furosemide  40 mg Oral BID  . influenza vac split quadrivalent PF  0.5 mL Intramuscular Tomorrow-1000  . lisinopril  5 mg Oral Daily  . pneumococcal 23 valent vaccine  0.5 mL Intramuscular Tomorrow-1000  . potassium chloride  40 mEq Oral BID  . rivaroxaban  20 mg Oral Q supper    OBJECTIVE  Filed Vitals:   09/04/13 1635 09/04/13 2025 09/05/13 0010 09/05/13 0400  BP: 101/75 134/72 146/91 127/88  Pulse: 71 72 62 68  Temp:  98.2 F (36.8 C) 98.6 F (37 C) 97.9 F (36.6 C)  TempSrc:  Oral Oral Oral  Resp: 20 18 18 18   Height:      Weight:    192 lb 3.9 oz (87.2 kg)  SpO2: 100% 98% 97% 95%    Intake/Output Summary (Last 24 hours) at 09/05/13 1033 Last data filed at 09/05/13 0900  Gross per 24 hour  Intake 2182.08 ml  Output   4225 ml  Net -2042.92 ml   Filed Weights   09/03/13 0350 09/04/13 0400 09/05/13 0400  Weight: 193 lb 12.6 oz (87.9 kg) 193 lb 12.6 oz (87.9 kg) 192 lb 3.9 oz (87.2 kg)    PHYSICAL EXAM  General: Pleasant, NAD. Looking much more comfortable today Neuro: Alert and oriented X 3. Moves all extremities spontaneously. Psych: Normal affect. HEENT:  Normal  Neck: Supple without bruits or No JVD.    Lungs:  Resp regular and unlabored, lungs are clear Heart: RRR no s3, s4, or murmurs. Abdomen: Soft, non-tender, non-distended Extremities: Pulses 2+ BL, chronic swelling and skin changes in right leg 2/2 hamstring tear in late Sept. No pitting edema  Accessory Clinical Findings  Recent Labs  09/04/13 0425 09/05/13 0425  WBC 6.0 5.6  HGB  14.1 14.4  HCT 40.6 41.1  MCV 101.0* 100.0  PLT 213 218   Basic Metabolic Panel  Recent Labs  09/03/13 0340  NA 139  K 4.6  CL 107  CO2 26  GLUCOSE 96  BUN 17  CREATININE 1.12  CALCIUM 8.7   Liver Function Tests No results found for this basename: AST, ALT, ALKPHOS, BILITOT, PROT, ALBUMIN,  in the last 72 hours No results found for this basename: CKTOTAL, CKMB, CKMBINDEX, TROPONINI,  in the last 72 hours BNP No components found with this basename: POCBNP,  D-Dimer No results found for this basename: DDIMER,  in the last 72 hours Hemoglobin A1C No results found for this basename: HGBA1C,  in the last 72 hours  Thyroid Function Tests No results found for this basename: TSH, T4TOTAL, FREET3, T3FREE, THYROIDAB,  in the last 72 hours  TELE  HR: 75 bmp NSR  ECG 09/01/13 ~ 5pm Deep T wave inversion and ant  Radiology/Studies Ct Angio Chest Pe W/cm &/or Wo Cm  09/01/2013    IMPRESSION: Degraded by respiratory motion. Lower lobe segmental and more peripheral branches are nondiagnostic. Otherwise, no pulmonary embolism identified.  Dilatation of the main pulmonary artery can be seen in the setting of pulmonary arterial hypertension.  Cardiomegaly with  small pleural effusions and a pulmonary edema pattern.    TTE 09/02/2013 - Left ventricle: The cavity size was moderately dilated. Wall thickness was increased in a pattern of mild LVH. Systolic function was moderately to severely reduced. The estimated ejection fraction was in the range of 30% to 35%. Diffuse hypokinesis. Left ventricular diastolic function parameters were normal. - Aortic valve: Mild regurgitation. - Mitral valve: Mild regurgitation. - Left atrium: The atrium was mildly to moderately dilated. - Right atrium: The atrium was mildly dilated. - Pulmonary arteries: PA peak pressure: 45mm Hg (S).   Cardiac cath 09/03/2013 Left main: Large vessel. 30% distal stenosis  LAD: Long vessel wraps apex. Gives off two  diagonals. Proximal calcification. 40% proximal lesion. 40-50% smooth shelf-like lesion in midvessel. Small intramyocardial segment at apex.  LCX: Gives off large branching OM-1, Small OM-2 and OM-3. 40% lesion in mid AV -groove LCX. 30% lesion in proximal portion of upper branch of OM-1. 50% lesion in ostium of small OM-3.  RCA: Dominant vessel. Mild aneurysmal segment in midsection. Minimal plaque.  LV-gram done in the RAO projection: Ejection fraction = 20-25% global HK  Assessment:  1. Nonobstructive CAD  2. Nonischemic CM with severe LV dysfunction EF 20-25%  Plan/Discussion:  Proceed with medical therapy. Treatment of AFL per EP team.   ASSESSMENT AND PLAN   Justin Huerta is a 52 y.o. male with a history of tobacco abuse, myoblastoma in remission and no past cardiac history who presented to the ED with atrial flutter with RVR, hypertension and florid congestive heart failure.   1. Atrial Flutter/atrial fibrillation: patient came in with a-flutter with 2:1 A-V conduction (HR: 145) with stage IV heart failure, The patient cardioverted spontaneously into SR with a very short PR. 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 EP (Dr Graciela Husbands) was consulted for a possible a flutter ablation and consideration of aberrancy. His cardiac catheterization yesterday showed non-obstructive CAD. The plan by EP was to wait until his LV EF recovers (assuming this is tach-induced CMP) and perform EP study for a-flutter ablation and evaluation for possible WPW. This morning he went into rapid atrial fibrillation. He was restarted on Amiodarone, his carvedilol is uptitrated.  We are converting heparin drip to xarelto.  I have changed his IV amio to PO amio   2. Acute congestive heart failure -possibly tachycardia induced, non-obstructive CAD on cath.  Echocardiogram showed moderately dilated LV with LV EF 30-35% and moderately enlarged left atrium. This is suggestive of rather chronic than acute heart failure.  - BNP  5936, CXR/CT reveal cardiomegaly, pulm edema and bilateral pleural effusions on admission, now improved - Elevated transaminases, normalizing with diuresis  - Marked symptomatic improvement on IV lasix:  net output -10L, down - 8lbs  - Continue PO Lasix 40 mg BID, check BMP today.  He has been fitted for his LifeVest.,  He can probably go home soon -likely  Tomorrow if he stays in NSR.     3. Hypertension - BP normalized with current therapy  4. Hypokalemia - resolved, supplemented with Kdur 40 mg po bid   5. Thyroid enlargement - Thyroid studies normal, follow up as an outpatient   6. Tobacco Abuse - Patient reports that he quit last week  Elyn Aquas. 09/05/2013

## 2013-09-05 NOTE — Progress Notes (Addendum)
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 The patient states that the shortness of breath started about 4-6 weeks ago mainly with exertion. He subsequently noted palpitations which started about 2 weeks ago. These were intermittent in nature and he suspected he was having panic attacks. He states that when he tried to fall asleep he would wake up short of breath. He tried nor these symptoms up until he noted his heart rate was greater than 150 when checked on a monitor in the pharmacy and he began to have symptoms of shortness of breath and palpitations again  Assessment/Plan: Active Problems:   Atrial flutter with rapid ventricular response -has gone back in to a-flutter and has been started on Amio infusion per cardiology -also on oral coreg -Cardiology will arrange for novel anticoagulant samples for the patient -Plan is for an outpatient EP study and possible ablation as an outpatient after reassessing LV function  Near Syncope -Patient relates this to stress due to personal issues however, do to poor EF he will need to be discharged with a life vest  Acute systolic CHF (congestive heart failure), NYHA class 2 -Difficult to tell if this is secondary to uncontrolled heart rate versus other reasons -EF will be reassessed with a TTE as an outpatient by cardiology -Discharge with life vest as mentioned above  Elevated troponin level -Cardiac cath - cardiac cath reveals clean coronaries.     Tobacco abuse -Counseled  to discontinue    Cannabis abuse, continuous use -Counseled to discontinue    Transaminitis -Hepatitis  panel negative -Follow -Possibly due to hepatic congestion as a result of heart failure  Hypokalemia -Replaced appropriately -Magnesium level is within normal limits   Code Status: Full code Family Communication: Family at bedside on 12/4 Disposition Plan: Follow on telemetry- will go home when cleared by cardiology  Consultants: Cardiology  Procedures: None  Antibiotics: None  DVT prophylaxis: Heparin  HPI/Subjective: Patient alert- no palpitations or dyspnea    Objective: Blood pressure 127/88, pulse 68, temperature 97.9 F (36.6 C), temperature source Oral, resp. rate 18, height 5\' 11"  (1.803 m), weight 87.2 kg (192 lb 3.9 oz), SpO2 95.00%.  Intake/Output Summary (Last 24 hours) at 09/05/13 0747 Last data filed at 09/05/13 0659  Gross per 24 hour  Intake 1942.08 ml  Output   3875 ml  Net -1932.92 ml     Exam: General: No acute respiratory distress Lungs: Clear to auscultation bilaterally without wheezes or crackles Cardiovascular: Irregular rate and rhythm - HR 110-120s Abdomen: Nontender, nondistended, soft, bowel sounds positive, no rebound, no ascites, no appreciable mass Extremities: No significant cyanosis, clubbing, or edema bilateral lower extremities  Data Reviewed: Basic Metabolic Panel:  Recent Labs Lab 09/01/13 0249 09/01/13 0742 09/01/13 2005 09/02/13 0028 09/03/13 0340  NA 135  --  136 139  141 139  K 3.4*  --  3.4* 3.0*  3.1* 4.6  CL 96  --  97 99  100 107  CO2 24  --  26 27  27 26   GLUCOSE 125*  --  119* 90  90 96  BUN 17  --  21 22  22 17   CREATININE  1.19  --  1.40* 1.24  1.25 1.12  CALCIUM 8.8  --  8.6 8.4  8.3* 8.7  MG  --  1.9  --  1.9  --    Liver Function Tests:  Recent Labs Lab 09/01/13 0742 09/02/13 0028  AST 60* 44*  ALT 130* 107*  ALKPHOS 75 66  BILITOT 0.9 0.6  PROT 7.2 6.6  ALBUMIN 3.4* 3.2*   No results found for this basename: LIPASE, AMYLASE,  in the last 168 hours No results found for this  basename: AMMONIA,  in the last 168 hours CBC:  Recent Labs Lab 09/01/13 0249 09/02/13 0028 09/02/13 0940 09/04/13 0425 09/05/13 0425  WBC 7.9 8.0 6.3 6.0 5.6  HGB 14.6 13.7 13.6 14.1 14.4  HCT 41.0 38.8* 38.6* 40.6 41.1  MCV 99.0 99.7 99.5 101.0* 100.0  PLT 214 194 185 213 218   Cardiac Enzymes:  Recent Labs Lab 09/01/13 0531 09/01/13 0946 09/01/13 1640 09/01/13 2005  CKTOTAL 354* 374*  --   --   CKMB  --  7.8*  --   --   TROPONINI  --  0.37* 0.33* 0.34*   BNP (last 3 results)  Recent Labs  09/01/13 0249  PROBNP 5936.0*   CBG: No results found for this basename: GLUCAP,  in the last 168 hours  Recent Results (from the past 240 hour(s))  MRSA PCR SCREENING     Status: None   Collection Time    09/01/13  3:06 PM      Result Value Range Status   MRSA by PCR NEGATIVE  NEGATIVE Final   Comment:            The GeneXpert MRSA Assay (FDA     approved for NASAL specimens     only), is one component of a     comprehensive MRSA colonization     surveillance program. It is not     intended to diagnose MRSA     infection nor to guide or     monitor treatment for     MRSA infections.     Studies:  Recent x-ray studies have been reviewed in detail by the Attending Physician  Scheduled Meds:  Scheduled Meds: . carvedilol  12.5 mg Oral BID WC  . furosemide  40 mg Oral BID  . influenza vac split quadrivalent PF  0.5 mL Intramuscular Tomorrow-1000  . lisinopril  5 mg Oral Daily  . pneumococcal 23 valent vaccine  0.5 mL Intramuscular Tomorrow-1000  . potassium chloride  40 mEq Oral BID  . rivaroxaban  20 mg Oral Q supper   Continuous Infusions: . amiodarone (NEXTERONE PREMIX) 360 mg/200 mL dextrose 30 mg/hr (09/04/13 2227)    Time spent on care of this patient: 35 minutes   Calvert Cantor, MD  [facility] Office  [phone] Pager - Text Page per Loretha Stapler as per below:  On-Call/Text Page:      [email or web address]      password [email or web address]  If 7PM-7AM, please  contact night-coverage [email or web address] Password [email or web address] 09/05/2013, 7:47 AM   LOS: 4 days

## 2013-09-05 NOTE — Progress Notes (Signed)
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 The patient states that the shortness of breath started about 4-6 weeks ago mainly with exertion. He subsequently noted palpitations which started about 2 weeks ago. These were intermittent in nature and he suspected he was having panic attacks. He states that when he tried to fall asleep he would wake up short of breath. He tried nor these symptoms up until he noted his heart rate was greater than 150 when checked on a monitor in the pharmacy and he began to have symptoms of shortness of breath and palpitations again  Assessment/Plan: Active Problems:   Atrial flutter with rapid ventricular response -rate went back in to a-flutter - started on Amio infusion per cardiology- converted to oral Amio -also on oral coreg -Cardiology will arrange for novel anticoagulant samples for the patient -Plan is for an outpatient EP study and possible ablation as an outpatient after reassessing LV function  Near Syncope -Patient relates this to stress due to personal issues however, do to poor EF he will need to be discharged with a life vest  Acute systolic CHF (congestive heart failure), NYHA class 2 -Difficult to tell if this is secondary to uncontrolled heart rate versus other reasons -EF will be reassessed with a TTE as an outpatient by cardiology -Discharge with life vest as mentioned above  Elevated troponin level -Cardiac cath - cardiac cath reveals clean coronaries.     Tobacco abuse -Counseled  to discontinue    Cannabis abuse, continuous use -Counseled to discontinue     Transaminitis -Hepatitis panel negative -Follow -Possibly due to hepatic congestion as a result of heart failure  Hypokalemia -Replaced appropriately -Magnesium level is within normal limits   Code Status: Full code Family Communication: Family at bedside on 12/4 Disposition Plan: Follow on telemetry- will go home when cleared by cardiology  Consultants: Cardiology  Procedures: None  Antibiotics: None  DVT prophylaxis: Heparin  HPI/Subjective: Patient alert- no palpitations or dyspnea - HR back in sinus   Objective: Blood pressure 127/88, pulse 68, temperature 97.9 F (36.6 C), temperature source Oral, resp. rate 18, height 5\' 11"  (1.803 m), weight 87.2 kg (192 lb 3.9 oz), SpO2 95.00%.  Intake/Output Summary (Last 24 hours) at 09/05/13 1649 Last data filed at 09/05/13 1500  Gross per 24 hour  Intake 2062.08 ml  Output   3925 ml  Net -1862.92 ml     Exam: General: No acute respiratory distress Lungs: Clear to auscultation bilaterally without wheezes or crackles Cardiovascular: Irregular rate and rhythm - HR 110-120s Abdomen: Nontender, nondistended, soft, bowel sounds positive, no rebound, no ascites, no appreciable mass Extremities: No significant cyanosis, clubbing, or edema bilateral lower extremities  Data Reviewed: Basic Metabolic Panel:  Recent Labs Lab 09/01/13 0249 09/01/13 0742 09/01/13 2005 09/02/13 0028 09/03/13 0340 09/05/13 1320  NA 135  --  136 139  141 139 136  K 3.4*  --  3.4* 3.0*  3.1* 4.6 4.8  CL 96  --  97 99  100 107 97  CO2 24  --  26 27  27 26 29   GLUCOSE 125*  --  119* 90  90 96 102*  BUN 17  --  21 22  22 17 20   CREATININE 1.19  --  1.40* 1.24  1.25 1.12 1.73*  CALCIUM 8.8  --  8.6 8.4  8.3* 8.7 9.7  MG  --  1.9  --  1.9  --   --    Liver Function Tests:  Recent Labs Lab 09/01/13 0742 09/02/13 0028  AST 60* 44*  ALT 130* 107*  ALKPHOS 75 66  BILITOT 0.9 0.6  PROT 7.2 6.6  ALBUMIN 3.4* 3.2*   No results  found for this basename: LIPASE, AMYLASE,  in the last 168 hours No results found for this basename: AMMONIA,  in the last 168 hours CBC:  Recent Labs Lab 09/01/13 0249 09/02/13 0028 09/02/13 0940 09/04/13 0425 09/05/13 0425  WBC 7.9 8.0 6.3 6.0 5.6  HGB 14.6 13.7 13.6 14.1 14.4  HCT 41.0 38.8* 38.6* 40.6 41.1  MCV 99.0 99.7 99.5 101.0* 100.0  PLT 214 194 185 213 218   Cardiac Enzymes:  Recent Labs Lab 09/01/13 0531 09/01/13 0946 09/01/13 1640 09/01/13 2005  CKTOTAL 354* 374*  --   --   CKMB  --  7.8*  --   --   TROPONINI  --  0.37* 0.33* 0.34*   BNP (last 3 results)  Recent Labs  09/01/13 0249  PROBNP 5936.0*   CBG: No results found for this basename: GLUCAP,  in the last 168 hours  Recent Results (from the past 240 hour(s))  MRSA PCR SCREENING     Status: None   Collection Time    09/01/13  3:06 PM      Result Value Range Status   MRSA by PCR NEGATIVE  NEGATIVE Final   Comment:            The GeneXpert MRSA Assay (FDA     approved for NASAL specimens     only), is one component of a     comprehensive MRSA colonization     surveillance program. It is not     intended to diagnose MRSA     infection nor to guide or     monitor treatment for     MRSA infections.     Studies:  Recent x-ray studies have been reviewed in detail by the Attending Physician  Scheduled Meds:  Scheduled Meds: . amiodarone  200 mg Oral BID  . carvedilol  12.5 mg Oral BID WC  . furosemide  40 mg Oral BID  . influenza vac split quadrivalent PF  0.5 mL Intramuscular Tomorrow-1000  . lisinopril  5 mg Oral Daily  . pneumococcal 23 valent vaccine  0.5 mL Intramuscular Tomorrow-1000  . potassium chloride  40 mEq Oral BID  . rivaroxaban  20 mg Oral Q supper   Continuous Infusions:    Time spent on care of this patient: 35 minutes   Calvert Cantor, MD  [facility] Office  [phone] Pager - Text Page per Loretha Stapler as per below:  On-Call/Text Page:      [email or web address]       password [email or web address]  If 7PM-7AM, please contact night-coverage [email or web address] Password [email or web address] 09/05/2013, 4:49 PM   LOS: 4 days

## 2013-09-06 DIAGNOSIS — I5022 Chronic systolic (congestive) heart failure: Secondary | ICD-10-CM

## 2013-09-06 LAB — CBC
HCT: 43.6 % (ref 39.0–52.0)
MCH: 35.3 pg — ABNORMAL HIGH (ref 26.0–34.0)
MCHC: 35.3 g/dL (ref 30.0–36.0)
MCV: 100 fL (ref 78.0–100.0)
Platelets: 219 10*3/uL (ref 150–400)
RBC: 4.36 MIL/uL (ref 4.22–5.81)
RDW: 12.4 % (ref 11.5–15.5)
WBC: 5.7 10*3/uL (ref 4.0–10.5)

## 2013-09-06 MED ORDER — LISINOPRIL 5 MG PO TABS: 5.0000 mg | ORAL_TABLET | Freq: Every day | ORAL | Status: DC

## 2013-09-06 MED ORDER — POTASSIUM CHLORIDE CRYS ER 20 MEQ PO TBCR
20.0000 meq | EXTENDED_RELEASE_TABLET | Freq: Every day | ORAL | Status: DC
  Administered 2013-09-06: 20 meq via ORAL

## 2013-09-06 MED ORDER — POTASSIUM CHLORIDE CRYS ER 20 MEQ PO TBCR: 20.0000 meq | EXTENDED_RELEASE_TABLET | Freq: Every day | ORAL | Status: DC

## 2013-09-06 MED ORDER — FUROSEMIDE 40 MG PO TABS: 40.0000 mg | ORAL_TABLET | Freq: Every day | ORAL | Status: DC

## 2013-09-06 MED ORDER — RIVAROXABAN 20 MG PO TABS: 20.0000 mg | ORAL_TABLET | Freq: Every day | ORAL | Status: DC

## 2013-09-06 MED ORDER — AMIODARONE HCL 200 MG PO TABS: 200.0000 mg | ORAL_TABLET | Freq: Two times a day (BID) | ORAL | Status: DC

## 2013-09-06 MED ORDER — CARVEDILOL 12.5 MG PO TABS: 12.5000 mg | ORAL_TABLET | Freq: Two times a day (BID) | ORAL | Status: DC

## 2013-09-06 MED ORDER — ZOLPIDEM TARTRATE 5 MG PO TABS: 5.0000 mg | ORAL_TABLET | Freq: Every evening | ORAL | Status: DC | PRN

## 2013-09-06 NOTE — Discharge Summary (Signed)
Physician Discharge Summary  Justin Huerta:[id number] DOB: 07-23-1961 DOA: 09/01/2013  PCP: No PCP Per Patient  Admit date: 09/01/2013 Discharge date: 09/06/2013  Time spent: >45 minutes  Recommendations for Outpatient Follow-up:  1. Repeat LFTs in 2 wks 2. 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  The patient states that the shortness of breath started about 4-6 weeks ago mainly with exertion. He subsequently noted palpitations which started about 2 weeks ago. These were intermittent in nature and he suspected he was having panic attacks. He states that when he tried to fall asleep he would wake up short of breath. He tried nor these symptoms up until he noted his heart rate was greater than 150 when checked on a monitor in the pharmacy and he began to have symptoms of shortness of breath and palpitations again   Hospital Course:  Atrial flutter with rapid ventricular response  - converted on Cardizem infusion and transitioned to oral Cardizem  - Coreg also added -rate went back in to a-flutter 12/4 - started on Amio infusion per cardiology- converted to oral Amio  -Cardiology will arrange for novel anticoagulant samples for the patient  -Plan is for an outpatient EP study and possible ablation as an  outpatient after reassessing LV function    Near Syncope  -occurred a couple of weeks ago -Patient relates this to stress due to personal issues however, do to poor EF he will need to be discharged with a life vest    Acute systolic CHF (congestive heart failure), NYHA class 2 - EF 20-25% -suspected to be due to uncontrolled heart rate -EF will be reassessed with a TTE as an outpatient by cardiology  -Discharge with life vest as mentioned above    Elevated troponin level  -Cardiac cath reveals clean coronaries.    Tobacco abuse  -Counseled to discontinue    Cannabis abuse, continuous use  -Counseled to discontinue    Transaminitis  -Hepatitis panel negative  -Possibly due to hepatic congestion as a result of heart failure    Hypokalemia  -Replaced appropriately  -Magnesium level is within normal limits   Procedures: 12/4 cardiac cath- 1. Nonobstructive CAD  2. Nonischemic CM with severe LV dysfunction EF 20-25%    Consultations:  cardiology  Discharge Exam: Filed Vitals:   09/06/13 0625  BP: 139/93  Pulse: 72  Temp:   Resp: 16    General: AAO x3, no distress Cardiovascular: RRR, no murmurs Respiratory: CTA b/l   Discharge Instructions      Discharge Orders   Future Appointments Provider Department Dept Phone   09/30/2013 2:30 PM Doris Cheadle, MD [facility] [phone]   Future Orders Complete By Expires   Diet - low sodium heart healthy  As directed  Increase activity slowly  As directed        Medication List         amiodarone 200 MG tablet  Commonly known as:  PACERONE  Take 1 tablet (200 mg total) by mouth 2 (two) times daily.     carvedilol 12.5 MG tablet  Commonly known as:  COREG  Take 1 tablet (12.5 mg total) by mouth 2 (two) times daily with a meal.     CINNAMON PO  Take 1 capsule by mouth daily.     EXCEDRIN PM PO  Take 1-2 tablets by mouth at bedtime as needed (sleep aid).      furosemide 40 MG tablet  Commonly known as:  LASIX  Take 1 tablet (40 mg total) by mouth daily.  Start taking on:  09/07/2013     lisinopril 5 MG tablet  Commonly known as:  PRINIVIL,ZESTRIL  Take 1 tablet (5 mg total) by mouth daily.     potassium chloride SA 20 MEQ tablet  Commonly known as:  K-DUR,KLOR-CON  Take 1 tablet (20 mEq total) by mouth daily.     Rivaroxaban 20 MG Tabs tablet  Commonly known as:  XARELTO  Take 1 tablet (20 mg total) by mouth daily with supper.     simethicone 80 MG chewable tablet  Commonly known as:  MYLICON  Chew 80 mg by mouth once.       Allergies  Allergen Reactions  . Acetaminophen     "shaky" "makes me angry"  . Peanut Butter Flavor     Breathing problems, swells lips   Follow-up Information   Follow up with [facility] On 09/30/2013. (@ 2:30 pm for hospital f/u)    Contact information:   [address] [place] [address] [phone]      Call [facility] Main Office [facility]). (On Monday)    Specialty:  Cardiology   Contact information:   [address], [address] [place] [address] [phone]       The results of significant diagnostics from this hospitalization (including imaging, microbiology, ancillary and laboratory) are listed below for reference.    Significant Diagnostic Studies: Ct Angio Chest Pe W/cm &/or Wo Cm  09/01/2013   CLINICAL DATA:  Mid chest pressure/tightness  EXAM: CT ANGIOGRAPHY CHEST WITH CONTRAST  TECHNIQUE: Multidetector CT imaging of the chest was performed using the standard protocol during bolus administration of intravenous contrast. Multiplanar CT image reconstructions including MIPs were obtained to evaluate the vascular anatomy.  CONTRAST:  OMNIPAQUE IOHEXOL 350 MG/ML SOLN  COMPARISON:  None.  FINDINGS: Lower lobe segmental pulmonary arterial branches are nondiagnostic due to respiratory motion. Within this limitation, no pulmonary  embolism identified elsewhere. The main pulmonary artery dilates up to 3.8 cm.  Scattered atherosclerosis of the aorta and branch vessels. Mild focal outpouching along the arch, with the aorta measuring up to 3.4 cm. Trace pericardial fluid. Cardiomegaly. Coronary artery calcification. Small right and trace left pleural effusions.  Central airways are patent. Peribronchial thickening. Lower lobe interlobular septal thickening. Dependent and/or compressive lower lobe atelectasis.  Limited upper abdominal images show a few hypodensities within the left hepatic lobe, favored reflects cyst. Partially imaged cyst arising from the upper pole left kidney measures water attenuation. Mildly prominent mediastinal lymph nodes measuring up to 1 cm short axis, likely reactive.  No acute osseous finding.  Review of the MIP images confirms the above findings.  IMPRESSION: Degraded by respiratory motion. Lower  lobe segmental and more peripheral branches are nondiagnostic. Otherwise, no pulmonary embolism identified.  Dilatation of the main pulmonary artery can be seen in the setting of pulmonary arterial hypertension.  Cardiomegaly with small pleural effusions and a pulmonary edema pattern.   Electronically Signed   By: Jearld Lesch M.D.   On: 09/01/2013 04:53   Dg Chest Port 1 View  09/01/2013   CLINICAL DATA:  Chest pain, tightness, shortness of breath  EXAM: PORTABLE CHEST - 1 VIEW  COMPARISON:  09/01/2013 chest CT  FINDINGS: Cardiomegaly. Central pulmonary vascular prominence. Interstitial prominence Probable small effusions. Mild bibasilar atelectasis. No pneumothorax. No acute osseous finding.  IMPRESSION: Prominent cardiomediastinal contours. Interstitial prominence may reflect interstitial edema.  Trace effusions and mild lung base opacities; atelectasis versus infiltrate.   Electronically Signed   By: Jearld Lesch M.D.   On: 09/01/2013 05:35    Microbiology: Recent Results (from the past 240 hour(s))  MRSA  PCR SCREENING     Status: None   Collection Time    09/01/13  3:06 PM      Result Value Range Status   MRSA by PCR NEGATIVE  NEGATIVE Final   Comment:            The GeneXpert MRSA Assay (FDA     approved for NASAL specimens     only), is one component of a     comprehensive MRSA colonization     surveillance program. It is not     intended to diagnose MRSA     infection nor to guide or     monitor treatment for     MRSA infections.     Labs: Basic Metabolic Panel:  Recent Labs Lab 09/01/13 0249 09/01/13 0742 09/01/13 2005 09/02/13 0028 09/03/13 0340 09/05/13 1320  NA 135  --  136 139  141 139 136  K 3.4*  --  3.4* 3.0*  3.1* 4.6 4.8  CL 96  --  97 99  100 107 97  CO2 24  --  26 27  27 26 29   GLUCOSE 125*  --  119* 90  90 96 102*  BUN 17  --  21 22  22 17 20   CREATININE 1.19  --  1.40* 1.24  1.25 1.12 1.73*  CALCIUM 8.8  --  8.6 8.4  8.3* 8.7 9.7  MG  --  1.9  --  1.9  --   --    Liver Function Tests:  Recent Labs Lab 09/01/13 0742 09/02/13 0028  AST 60* 44*  ALT 130* 107*  ALKPHOS 75 66  BILITOT 0.9 0.6  PROT 7.2 6.6  ALBUMIN 3.4* 3.2*   No results found for this basename: LIPASE, AMYLASE,  in the last 168 hours No results found for this basename: AMMONIA,  in the last 168 hours CBC:  Recent Labs Lab 09/02/13 0028 09/02/13 0940 09/04/13 0425 09/05/13 0425 09/06/13 0450  WBC 8.0 6.3 6.0 5.6 5.7  HGB 13.7 13.6 14.1 14.4 15.4  HCT 38.8* 38.6* 40.6 41.1 43.6  MCV 99.7 99.5 101.0* 100.0 100.0  PLT 194 185 213 218 219   Cardiac Enzymes:  Recent Labs Lab 09/01/13 0531 09/01/13 0946 09/01/13 1640 09/01/13 2005  CKTOTAL 354* 374*  --   --   CKMB  --  7.8*  --   --   TROPONINI  --  0.37* 0.33* 0.34*   BNP: BNP (last 3 results)  Recent Labs  09/01/13 0249  PROBNP 5936.0*   CBG:  No results found for this basename: GLUCAP,  in the last 168 hours     Signed:  [facility]  [facility] 09/06/2013, 1:55 PM

## 2013-09-06 NOTE — Progress Notes (Signed)
Patient Name: Justin Huerta Date of Encounter: 09/06/2013  Active Problems:   Atrial flutter with rapid ventricular response   Tobacco abuse   Acute systolic CHF (congestive heart failure), NYHA class 2   Cannabis abuse, continuous use   Atrial fibrillation   SUBJECTIVE  The patient went into atrial fibrillation with RVR (HR 140 BPM) and feels mild SOB.   CURRENT MEDS . amiodarone  200 mg Oral BID  . carvedilol  12.5 mg Oral BID WC  . furosemide  40 mg Oral BID  . influenza vac split quadrivalent PF  0.5 mL Intramuscular Tomorrow-1000  . lisinopril  5 mg Oral Daily  . pneumococcal 23 valent vaccine  0.5 mL Intramuscular Tomorrow-1000  . potassium chloride  40 mEq Oral BID  . rivaroxaban  20 mg Oral Q supper    OBJECTIVE  Filed Vitals:   09/05/13 1810 09/05/13 2050 09/06/13 0500 09/06/13 0625  BP: 117/66 126/86  139/93  Pulse: 64 71  72  Temp:  97.6 F (36.4 C)    TempSrc:  Oral    Resp:  16  16  Height:      Weight:   190 lb 3.2 oz (86.274 kg)   SpO2:  100%  100%    Intake/Output Summary (Last 24 hours) at 09/06/13 0911 Last data filed at 09/05/13 1700  Gross per 24 hour  Intake    240 ml  Output    800 ml  Net   -560 ml   Filed Weights   09/04/13 0400 09/05/13 0400 09/06/13 0500  Weight: 193 lb 12.6 oz (87.9 kg) 192 lb 3.9 oz (87.2 kg) 190 lb 3.2 oz (86.274 kg)    PHYSICAL EXAM  General: Pleasant, NAD. Looking much more comfortable today Neuro: Alert and oriented X 3. Moves all extremities spontaneously. Psych: Normal affect. HEENT:  Normal  Neck: Supple without bruits or No JVD.    Lungs:  Resp regular and unlabored, lungs are clear Heart: RRR no s3, s4, or murmurs. Abdomen: Soft, non-tender, non-distended Extremities: Pulses 2+ BL, chronic swelling and skin changes in right leg 2/2 hamstring tear in late Sept. No pitting edema  Accessory Clinical Findings  Recent Labs  09/05/13 0425 09/06/13 0450  WBC 5.6 5.7  HGB 14.4 15.4  HCT 41.1 43.6   MCV 100.0 100.0  PLT 218 219   Basic Metabolic Panel  Recent Labs  09/05/13 1320  NA 136  K 4.8  CL 97  CO2 29  GLUCOSE 102*  BUN 20  CREATININE 1.73*  CALCIUM 9.7   Liver Function Tests No results found for this basename: AST, ALT, ALKPHOS, BILITOT, PROT, ALBUMIN,  in the last 72 hours No results found for this basename: CKTOTAL, CKMB, CKMBINDEX, TROPONINI,  in the last 72 hours BNP No components found with this basename: POCBNP,  D-Dimer No results found for this basename: DDIMER,  in the last 72 hours Hemoglobin A1C No results found for this basename: HGBA1C,  in the last 72 hours  Thyroid Function Tests No results found for this basename: TSH, T4TOTAL, FREET3, T3FREE, THYROIDAB,  in the last 72 hours  TELE  HR: 75 bmp NSR  ECG 09/01/13 ~ 5pm Deep T wave inversion and ant  Radiology/Studies Ct Angio Chest Pe W/cm &/or Wo Cm  09/01/2013    IMPRESSION: Degraded by respiratory motion. Lower lobe segmental and more peripheral branches are nondiagnostic. Otherwise, no pulmonary embolism identified.  Dilatation of the main pulmonary artery can be seen in the  setting of pulmonary arterial hypertension.  Cardiomegaly with small pleural effusions and a pulmonary edema pattern.    TTE 09/02/2013 - Left ventricle: The cavity size was moderately dilated. Wall thickness was increased in a pattern of mild LVH. Systolic function was moderately to severely reduced. The estimated ejection fraction was in the range of 30% to 35%. Diffuse hypokinesis. Left ventricular diastolic function parameters were normal. - Aortic valve: Mild regurgitation. - Mitral valve: Mild regurgitation. - Left atrium: The atrium was mildly to moderately dilated. - Right atrium: The atrium was mildly dilated. - Pulmonary arteries: PA peak pressure: 45mm Hg (S).   Cardiac cath 09/03/2013 Left main: Large vessel. 30% distal stenosis  LAD: Long vessel wraps apex. Gives off two diagonals. Proximal  calcification. 40% proximal lesion. 40-50% smooth shelf-like lesion in midvessel. Small intramyocardial segment at apex.  LCX: Gives off large branching OM-1, Small OM-2 and OM-3. 40% lesion in mid AV -groove LCX. 30% lesion in proximal portion of upper branch of OM-1. 50% lesion in ostium of small OM-3.  RCA: Dominant vessel. Mild aneurysmal segment in midsection. Minimal plaque.  LV-gram done in the RAO projection: Ejection fraction = 20-25% global HK  Assessment:  1. Nonobstructive CAD  2. Nonischemic CM with severe LV dysfunction EF 20-25%  Plan/Discussion:  Proceed with medical therapy. Treatment of AFL per EP team.   ASSESSMENT AND PLAN   Justin Huerta is a 52 y.o. male with a history of tobacco abuse, myoblastoma in remission and no past cardiac history who presented to the ED with atrial flutter with RVR, hypertension and florid congestive heart failure.   1. Atrial Flutter/atrial fibrillation: patient came in with a-flutter with 2:1 A-V conduction (HR: 145) with stage IV heart failure, The patient cardioverted spontaneously into SR with a very short PR. 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 EP (Dr Graciela Husbands) was consulted for a possible a flutter ablation and consideration of aberrancy. His cardiac catheterization yesterday showed non-obstructive CAD. The plan by EP was to wait until his LV EF recovers (assuming this is tach-induced CMP) and perform EP study for a-flutter ablation and evaluation for possible WPW. This morning he went into rapid atrial fibrillation. He was restarted on Amiodarone, his carvedilol is uptitrated.  We are converting heparin drip to xarelto.  I have changed his IV amio to PO amio   2. Acute congestive heart failure -possibly tachycardia induced, non-obstructive CAD on cath.  Echocardiogram showed moderately dilated LV with LV EF 30-35% and moderately enlarged left atrium. This is suggestive of rather chronic than acute heart failure.  - BNP 5936, CXR/CT reveal  cardiomegaly, pulm edema and bilateral pleural effusions on admission, now improved - Elevated transaminases, normalizing with diuresis  He has improved but his Creatinine has bumped today.   Will decrease Lasix to 40 a day - he may be slightly volume depleted. He has been fitted for his LifeVest.,    He should get a BMP in our office in 2-3 days to make sure that his renal function and potassium  is stable.   3. Hypertension - BP normalized with current therapy  4. Hypokalemia - resolved,  Will decrease is potassium supplement since we are decreasing his lasix dose.   5. Thyroid enlargement - Thyroid studies normal, follow up as an outpatient   6. Tobacco Abuse - Patient reports that he quit last week  Elyn Aquas. 09/06/2013

## 2013-09-07 ENCOUNTER — Telehealth: Payer: Self-pay | Admitting: Emergency Medicine

## 2013-09-21 ENCOUNTER — Ambulatory Visit (INDEPENDENT_AMBULATORY_CARE_PROVIDER_SITE_OTHER): Payer: Medicaid Other | Admitting: Physician Assistant

## 2013-09-21 ENCOUNTER — Inpatient Hospital Stay (HOSPITAL_COMMUNITY)
Admission: AD | Admit: 2013-09-21 | Discharge: 2013-09-22 | DRG: 308 | Disposition: A | Discharge status: Home / Self Care | Payer: Medicaid Other | Source: Ambulatory Visit | Attending: Internal Medicine | Admitting: Internal Medicine

## 2013-09-21 ENCOUNTER — Encounter: Payer: Self-pay | Admitting: Physician Assistant

## 2013-09-21 ENCOUNTER — Encounter (HOSPITAL_COMMUNITY): Payer: Self-pay | Admitting: Pharmacy Technician

## 2013-09-21 ENCOUNTER — Encounter (HOSPITAL_COMMUNITY): Payer: Self-pay | Admitting: General Practice

## 2013-09-21 VITALS — BP 81/50 | HR 118 | Ht 71.0 in | Wt 200.0 lb

## 2013-09-21 DIAGNOSIS — I4892 Unspecified atrial flutter: Principal | ICD-10-CM | POA: Diagnosis present

## 2013-09-21 DIAGNOSIS — I5023 Acute on chronic systolic (congestive) heart failure: Secondary | ICD-10-CM | POA: Diagnosis present

## 2013-09-21 DIAGNOSIS — F17200 Nicotine dependence, unspecified, uncomplicated: Secondary | ICD-10-CM | POA: Diagnosis present

## 2013-09-21 DIAGNOSIS — I2510 Atherosclerotic heart disease of native coronary artery without angina pectoris: Secondary | ICD-10-CM

## 2013-09-21 DIAGNOSIS — I10 Essential (primary) hypertension: Secondary | ICD-10-CM | POA: Diagnosis present

## 2013-09-21 DIAGNOSIS — R55 Syncope and collapse: Secondary | ICD-10-CM | POA: Diagnosis present

## 2013-09-21 DIAGNOSIS — I509 Heart failure, unspecified: Secondary | ICD-10-CM | POA: Diagnosis present

## 2013-09-21 DIAGNOSIS — I428 Other cardiomyopathies: Secondary | ICD-10-CM | POA: Diagnosis present

## 2013-09-21 DIAGNOSIS — I5022 Chronic systolic (congestive) heart failure: Secondary | ICD-10-CM

## 2013-09-21 DIAGNOSIS — N179 Acute kidney failure, unspecified: Secondary | ICD-10-CM | POA: Diagnosis present

## 2013-09-21 DIAGNOSIS — I4891 Unspecified atrial fibrillation: Secondary | ICD-10-CM | POA: Diagnosis present

## 2013-09-21 HISTORY — DX: Pneumonia, unspecified organism: J18.9

## 2013-09-21 HISTORY — DX: Headache: R51

## 2013-09-21 HISTORY — DX: Unspecified convulsions: R56.9

## 2013-09-21 HISTORY — DX: Personal history of other diseases of the digestive system: Z87.19

## 2013-09-21 HISTORY — DX: Shortness of breath: R06.02

## 2013-09-21 HISTORY — DX: Gastro-esophageal reflux disease without esophagitis: K21.9

## 2013-09-21 HISTORY — DX: Anxiety disorder, unspecified: F41.9

## 2013-09-21 LAB — COMPREHENSIVE METABOLIC PANEL
ALT: 13 U/L (ref 0–53)
AST: 14 U/L (ref 0–37)
Albumin: 3.1 g/dL — ABNORMAL LOW (ref 3.5–5.2)
Alkaline Phosphatase: 57 U/L (ref 39–117)
BUN: 26 mg/dL — ABNORMAL HIGH (ref 6–23)
CO2: 28 mEq/L (ref 19–32)
Calcium: 8.6 mg/dL (ref 8.4–10.5)
Chloride: 103 mEq/L (ref 96–112)
GFR calc non Af Amer: 44 mL/min — ABNORMAL LOW (ref >90)
Potassium: 3.4 mEq/L — ABNORMAL LOW (ref 3.5–5.1)
Sodium: 139 mEq/L (ref 135–145)
Total Bilirubin: 0.4 mg/dL (ref 0.3–1.2)

## 2013-09-21 LAB — CBC WITH DIFFERENTIAL/PLATELET
Basophils Absolute: 0 10*3/uL (ref 0.0–0.1)
Eosinophils Absolute: 0.1 10*3/uL (ref 0.0–0.7)
HCT: 39.6 % (ref 39.0–52.0)
Lymphocytes Relative: 34 % (ref 12–46)
MCH: 34.8 pg — ABNORMAL HIGH (ref 26.0–34.0)
MCHC: 34.8 g/dL (ref 30.0–36.0)
Monocytes Absolute: 0.3 10*3/uL (ref 0.1–1.0)
Neutro Abs: 2.6 10*3/uL (ref 1.7–7.7)
Neutrophils Relative %: 57 % (ref 43–77)
RDW: 12.1 % (ref 11.5–15.5)
WBC: 4.5 10*3/uL (ref 4.0–10.5)

## 2013-09-21 MED ORDER — AMIODARONE HCL 200 MG PO TABS
200.0000 mg | ORAL_TABLET | Freq: Two times a day (BID) | ORAL | Status: DC
  Administered 2013-09-21 – 2013-09-22 (×2): 200 mg via ORAL
  Filled 2013-09-21 (×4): qty 1

## 2013-09-21 MED ORDER — SODIUM CHLORIDE 0.9 % IJ SOLN: 3.0000 mL | INTRAMUSCULAR | Status: DC | PRN

## 2013-09-21 MED ORDER — ACETAMINOPHEN 325 MG PO TABS: 650.0000 mg | ORAL_TABLET | ORAL | Status: DC | PRN

## 2013-09-21 MED ORDER — SODIUM CHLORIDE 0.9 % IV SOLN: 250.0000 mL | INTRAVENOUS | Status: DC | PRN

## 2013-09-21 MED ORDER — ALPRAZOLAM 0.25 MG PO TABS
0.2500 mg | ORAL_TABLET | Freq: Two times a day (BID) | ORAL | Status: DC | PRN
  Administered 2013-09-21 (×2): 0.25 mg via ORAL
  Filled 2013-09-21 (×2): qty 1

## 2013-09-21 MED ORDER — FUROSEMIDE 40 MG PO TABS
40.0000 mg | ORAL_TABLET | Freq: Every day | ORAL | Status: DC
  Filled 2013-09-21 (×2): qty 1

## 2013-09-21 MED ORDER — ONDANSETRON HCL 4 MG/2ML IJ SOLN: 4.0000 mg | Freq: Four times a day (QID) | INTRAMUSCULAR | Status: DC | PRN

## 2013-09-21 MED ORDER — SODIUM CHLORIDE 0.9 % IJ SOLN
3.0000 mL | Freq: Two times a day (BID) | INTRAMUSCULAR | Status: DC
  Administered 2013-09-21: 3 mL via INTRAVENOUS

## 2013-09-21 MED ORDER — CARVEDILOL 12.5 MG PO TABS
12.5000 mg | ORAL_TABLET | Freq: Two times a day (BID) | ORAL | Status: DC
  Administered 2013-09-21 – 2013-09-22 (×3): 12.5 mg via ORAL
  Filled 2013-09-21 (×4): qty 1

## 2013-09-21 MED ORDER — POTASSIUM CHLORIDE CRYS ER 20 MEQ PO TBCR
20.0000 meq | EXTENDED_RELEASE_TABLET | Freq: Every day | ORAL | Status: DC
  Filled 2013-09-21: qty 1

## 2013-09-21 NOTE — H&P (Signed)
History and Physical   Date:  09/21/2013   ID:  Justin Huerta, DOB Jan 26, 1961, MRN [id number]  PCP:  No PCP Per Patient  Cardiologist:  Dr. Tobias Alexander   Electrophysiologist:  Dr. Sherryl Manges    History of Present Illness: Justin Huerta is a 52 y.o. male with a hx of tobacco abuse and myoblastoma in remission.  He was admitted 12/2-12/7 with acute CHF in the setting of atrial flutter with RVR and hypertension (systolic in the 180s).  He did have mildly elevated troponins. This was felt to be from demand ischemia. He improved with IV Lasix. Echocardiogram  (09/01/13): Mild LVH, EF 30-35%, diffuse HK, mild AI mild MR, mild to moderate LAE, mild RAE, PASP 45. He converted to NSR spontaneously. He had a short PR interval (? WPW syndrome). He was evaluated by Dr. Graciela Husbands for electrophysiology. Dr. Graciela Husbands recommended deferring catheter ablation to allow his ejection fraction to improve to limit periprocedural complications. He did have a recent history of syncope. Dr. Graciela Husbands recommended discharging him on a LifeVest. If the patient had recurrent atrial flutter, he would need catheter ablation sooner. The patient underwent catheterization given his newly diagnosed cardiomyopathy.  LHC (09/03/13): Distal left main 30, proximal LAD 40, mid LAD 40-50, mid AV groove circumflex 40, proximal OM1 30, ostial OM3 50, EF 20-25%. He had recurrent atrial fibrillation with RVR. He was placed on amiodarone. He converted to NSR.  He was discharged on Xarelto for anticoagulation. He did have mild AKI with diuresis.  AST/ALT was elevated likely from passive congestion.  Hep serologies were negative.     Since d/c, he has had a couple of episodes of sudden fatigue, nausea and substernal chest burning.  This has happened in the last 5 days.  He does note near syncope but denies recurrent syncope.  His breathing is improved.  He notes NYHA class 2 symptoms.  He denies orthopnea, PND.  He notes some pedal edema over the last few  days.  Weight is up 6 lbs by scales at home.     Recent Labs: 09/01/2013: Pro B Natriuretic peptide (BNP) 5936.0*; TSH 0.818  09/02/2013: ALT 107*; HDL 35*; LDL (calc) 111*  41/11/2438: Creatinine 1.73*; Potassium 4.8  09/06/2013: Hemoglobin 15.4   Wt Readings from Last 3 Encounters:  09/21/13 200 lb (90.719 kg)  09/06/13 190 lb 3.2 oz (86.274 kg)  09/06/13 190 lb 3.2 oz (86.274 kg)     Past Medical History  Diagnosis Date  . Granular cell myoblastoma, malignant     a. myoblastoma  approx 1998, in remission. underwent gamma knife  procedure  . Tobacco abuse     16 pack history   . Chronic systolic CHF (congestive heart failure)     a. Echocardiogram  (09/01/13): Mild LVH, EF 30-35%, diffuse HK, mild AI mild MR, mild to moderate LAE, mild RAE, PASP 45  . Coronary artery disease     a. NSTEMI 08/2013 in setting of AFlutter with RVR (demand ischemia);  LHC (09/03/13): Distal left main 30, proximal LAD 40, mid LAD 40-50, mid AV groove circumflex 40, proximal OM1 30, ostial OM3 50, EF 20-25%.  Marland Kitchen NICM (nonischemic cardiomyopathy)   . Atrial flutter with rapid ventricular response   . Atrial fibrillation     Past Surgical History  Procedure Laterality Date  . Knee surgery    . Hiatal hernia repair    . Gamma knife surgery      myoblastoma ~1998     Current Outpatient  Prescriptions  Medication Sig Dispense Refill  . amiodarone (PACERONE) 200 MG tablet Take 1 tablet (200 mg total) by mouth 2 (two) times daily.  60 tablet  0  . carvedilol (COREG) 12.5 MG tablet Take 1 tablet (12.5 mg total) by mouth 2 (two) times daily with a meal.  60 tablet  0  . CINNAMON PO Take 1 capsule by mouth daily.      . Diphenhydramine-APAP, sleep, (EXCEDRIN PM PO) Take 1-2 tablets by mouth at bedtime as needed (sleep aid).      . furosemide (LASIX) 40 MG tablet Take 1 tablet (40 mg total) by mouth daily.  30 tablet  0  . lisinopril (PRINIVIL,ZESTRIL) 5 MG tablet Take 1 tablet (5 mg total) by mouth daily.   30 tablet  0  . potassium chloride SA (K-DUR,KLOR-CON) 20 MEQ tablet Take 1 tablet (20 mEq total) by mouth daily.  30 tablet  0  . Rivaroxaban (XARELTO) 20 MG TABS tablet Take 1 tablet (20 mg total) by mouth daily with supper.  30 tablet  0  . simethicone (MYLICON) 80 MG chewable tablet Chew 80 mg by mouth once.       No current facility-administered medications for this visit.    Allergies:   Acetaminophen and Peanut butter flavor   Social History:  The patient  reports that he has been smoking Cigarettes.  He has a 16 pack-year smoking history. He does not have any smokeless tobacco history on file. He reports that he drinks alcohol. He reports that he does not use illicit drugs.   Family History:  The patient's family history includes Heart attack in his mother.   ROS:  Please see the history of present illness.   No fevers, cough, melena, hematochezia.   All other systems reviewed and negative.   PHYSICAL EXAM: VS:  BP 81/50  Pulse 118  Ht 5\' 11"  (1.803 m)  Wt 200 lb (90.719 kg)  BMI 27.91 kg/m2 Well nourished, well developed, in no acute distress HEENT: normal Neck: no JVD Cardiac:  normal S1, S2; RRR; no murmur Lungs:  clear to auscultation bilaterally, no wheezing, rhonchi or rales Abd: soft, nontender, no hepatomegaly Ext: no edema; right wrist without hematoma or mass  Skin: warm and dry Neuro:  CNs 2-12 intact, no focal abnormalities noted  EKG:  AFlutter, HR 118, variable AV block     ASSESSMENT AND PLAN:  1. Atrial Flutter with RVR:  Patient is back in AFlutter.  He is on Amiodarone and Xarelto.  He is symptomatic.  There was concern for WPW.  He was d/c on LifeVest due to hx of syncope and low EF.  I have reviewed with Dr. Dietrich Pates (DOD) who also saw the patient.  We have recommended admission to the hospital.  We will see if he can undergo RFCA tomorrow with EP.  Will have EP see him today upon arrival.  Continue current Rx for now.  Hold Xarelto starting today  for possible procedure tomorrow.  Continue current dose of Amiodarone.   2. Chronic Systolic CHF:  Volume appears stable.  Weight is up.  Continue current dose of Lasix for now.   3. Non-Ischemic CM:  Likely tachy mediated.  Continue beta blocker.  Hold ACEI given BP.  He will need a repeat Echo once he is maintaining NSR to reassess EF.   4. CAD:  NSTEMI was likely related to demand ischemia.  He is not on ASA as he is on Xarelto.  5. Syncope:  He was d/c on LifeVest given his cardiomyopathy:   6. Hypertension:  His BP is low today in the setting of AFlutter with RVR.  Hold Lisinopril for now.  Continue Carvedilol.   7. Disposition:  Admit to [facility] today.  Will have EP see him.  Plan for potential RFCA of AFlutter tomorrow (Dr. Ladona Ridgel).    Signed, Tereso Newcomer, PA-C  09/21/2013 10:45 AM

## 2013-09-21 NOTE — Progress Notes (Signed)
[address] [place], [place]  [address] Phone: [phone] Fax:  [phone]  Date:  09/21/2013   ID:  Justin Huerta, DOB 07-04-1961, MRN [id number]  PCP:  No PCP Per Patient  Cardiologist:  Dr. Tobias Alexander   Electrophysiologist:  Dr. Sherryl Manges    History of Present Illness: Justin Huerta is a 52 y.o. male with a hx of tobacco abuse and myoblastoma in remission.  He was admitted 12/2-12/7 with acute CHF in the setting of atrial flutter with RVR and hypertension (systolic in the 180s).  He did have mildly elevated troponins. This was felt to be from demand ischemia. He improved with IV Lasix. Echocardiogram  (09/01/13): Mild LVH, EF 30-35%, diffuse HK, mild AI mild MR, mild to moderate LAE, mild RAE, PASP 45. He converted to NSR spontaneously. He had a short PR interval (? WPW syndrome). He was evaluated by Dr. Graciela Husbands for electrophysiology. Dr. Graciela Husbands recommended deferring catheter ablation to allow his ejection fraction to improve to limit periprocedural complications. He did have a recent history of syncope. Dr. Graciela Husbands recommended discharging him on a LifeVest. If the patient had recurrent atrial flutter, he would need catheter ablation sooner. The patient underwent catheterization given his newly diagnosed cardiomyopathy.  LHC (09/03/13): Distal left main 30, proximal LAD 40, mid LAD 40-50, mid AV groove circumflex 40, proximal OM1 30, ostial OM3 50, EF 20-25%. He had recurrent atrial fibrillation with RVR. He was placed on amiodarone. He converted to NSR.  He was discharged on Xarelto for anticoagulation. He did have mild AKI with diuresis.  AST/ALT was elevated likely from passive congestion.  Hep serologies were negative.     Since d/c, he has had a couple of episodes of sudden fatigue, nausea and substernal chest burning.  This has happened in the last 5 days.  He does note near syncope but denies recurrent syncope.  His breathing is improved.  He notes NYHA class 2 symptoms.   He denies orthopnea, PND.  He notes some pedal edema over the last few days.  Weight is up 6 lbs by scales at home.     Recent Labs: 09/01/2013: Pro B Natriuretic peptide (BNP) 5936.0*; TSH 0.818  09/02/2013: ALT 107*; HDL 35*; LDL (calc) 111*  69/03/2951: Creatinine 1.73*; Potassium 4.8  09/06/2013: Hemoglobin 15.4   Wt Readings from Last 3 Encounters:  09/21/13 200 lb (90.719 kg)  09/06/13 190 lb 3.2 oz (86.274 kg)  09/06/13 190 lb 3.2 oz (86.274 kg)     Past Medical History  Diagnosis Date  . Granular cell myoblastoma, malignant     a. myoblastoma  approx 1998, in remission. underwent gamma knife  procedure  . Tobacco abuse     16 pack history   . Chronic systolic CHF (congestive heart failure)     a. Echocardiogram  (09/01/13): Mild LVH, EF 30-35%, diffuse HK, mild AI mild MR, mild to moderate LAE, mild RAE, PASP 45  . Coronary artery disease     a. NSTEMI 08/2013 in setting of AFlutter with RVR (demand ischemia);  LHC (09/03/13): Distal left main 30, proximal LAD 40, mid LAD 40-50, mid AV groove circumflex 40, proximal OM1 30, ostial OM3 50, EF 20-25%.  Marland Kitchen NICM (nonischemic cardiomyopathy)   . Atrial flutter with rapid ventricular response   . Atrial fibrillation     Past Surgical History  Procedure Laterality Date  . Knee surgery    . Hiatal hernia repair    . Gamma knife surgery  myoblastoma ~1998     Current Outpatient Prescriptions  Medication Sig Dispense Refill  . amiodarone (PACERONE) 200 MG tablet Take 1 tablet (200 mg total) by mouth 2 (two) times daily.  60 tablet  0  . carvedilol (COREG) 12.5 MG tablet Take 1 tablet (12.5 mg total) by mouth 2 (two) times daily with a meal.  60 tablet  0  . CINNAMON PO Take 1 capsule by mouth daily.      . Diphenhydramine-APAP, sleep, (EXCEDRIN PM PO) Take 1-2 tablets by mouth at bedtime as needed (sleep aid).      . furosemide (LASIX) 40 MG tablet Take 1 tablet (40 mg total) by mouth daily.  30 tablet  0  . lisinopril  (PRINIVIL,ZESTRIL) 5 MG tablet Take 1 tablet (5 mg total) by mouth daily.  30 tablet  0  . potassium chloride SA (K-DUR,KLOR-CON) 20 MEQ tablet Take 1 tablet (20 mEq total) by mouth daily.  30 tablet  0  . Rivaroxaban (XARELTO) 20 MG TABS tablet Take 1 tablet (20 mg total) by mouth daily with supper.  30 tablet  0  . simethicone (MYLICON) 80 MG chewable tablet Chew 80 mg by mouth once.       No current facility-administered medications for this visit.    Allergies:   Acetaminophen and Peanut butter flavor   Social History:  The patient  reports that he has been smoking Cigarettes.  He has a 16 pack-year smoking history. He does not have any smokeless tobacco history on file. He reports that he drinks alcohol. He reports that he does not use illicit drugs.   Family History:  The patient's family history includes Heart attack in his mother.   ROS:  Please see the history of present illness.   No fevers, cough, melena, hematochezia.   All other systems reviewed and negative.   PHYSICAL EXAM: VS:  BP 81/50  Pulse 118  Ht 5\' 11"  (1.803 m)  Wt 200 lb (90.719 kg)  BMI 27.91 kg/m2 Well nourished, well developed, in no acute distress HEENT: normal Neck: no JVD Cardiac:  normal S1, S2; RRR; no murmur Lungs:  clear to auscultation bilaterally, no wheezing, rhonchi or rales Abd: soft, nontender, no hepatomegaly Ext: no edema; right wrist without hematoma or mass  Skin: warm and dry Neuro:  CNs 2-12 intact, no focal abnormalities noted  EKG:  AFlutter, HR 118, variable AV block     ASSESSMENT AND PLAN:  1. Atrial Flutter with RVR:  Patient is back in AFlutter.  He is on Amiodarone and Xarelto.  He is symptomatic.  There was concern for WPW.  He was d/c on LifeVest due to hx of syncope and low EF.  I have reviewed with Dr. Dietrich Pates (DOD) who also saw the patient.  We have recommended admission to the hospital.  We will see if he can undergo RFCA tomorrow with EP.  Will have EP see him today  upon arrival.  Continue current Rx for now.  Hold Xarelto starting today for possible procedure tomorrow.  Continue current dose of Amiodarone.   2. Chronic Systolic CHF:  Volume appears stable.  Weight is up.  Continue current dose of Lasix for now.   3. Non-Ischemic CM:  Likely tachy mediated.  Continue beta blocker.  Hold ACEI given BP.  He will need a repeat Echo once he is maintaining NSR to reassess EF.   4. CAD:  NSTEMI was likely related to demand ischemia.  He is not  on ASA as he is on Xarelto.   5. Syncope:  He was d/c on LifeVest given his cardiomyopathy:   6. Hypertension:  His BP is low today in the setting of AFlutter with RVR.  Hold Lisinopril for now.  Continue Carvedilol.   7. Disposition:  Admit to [facility] today.  Will have EP see him.  Plan for potential RFCA of AFlutter tomorrow (Dr. Ladona Ridgel).    Signed, Tereso Newcomer, PA-C  09/21/2013 10:45 AM

## 2013-09-21 NOTE — Patient Instructions (Signed)
PT ADMITTED TO [facility]

## 2013-09-21 NOTE — Consult Note (Signed)
ELECTROPHYSIOLOGY CONSULT NOTE  Patient ID: Justin Huerta MRN: [id number], DOB/AGE: 1961/06/18   Admit date: 09/21/2013 Date of Consult: 09/21/2013  Primary Physician: No PCP Per Patient Primary Cardiologist: Delton See, MD Primary EP: Graciela Husbands, MD Reason for Consultation: Atrial flutter  History of Present Illness Justin Huerta is a 52 y.o. male with newly diagnosed atrial flutter, NICM, EF 20-25%, nonob CAD and myoblastoma in remission who presented to the office today for hospital follow-up. He reported fatigue, chest "burning" pain, intermittent SOB and increased LE swelling. He was found to have recurrent atrial flutter w/RVR and was then admitted to [facility]. Justin Huerta reports his symptoms started about 3 days ago. He denies palpitations or syncope. During his recent admission 09/01/2013 - 09/06/2013, he spontaneously converted to SR. There was some concern for WPW as he had a shortened PR interval but no Delta wave was present. Dr. Graciela Husbands deferred EPS +RF ablation to allow his ejection fraction to improve to limit periprocedural complications. However, if recurrent atrial flutter, would need ablation sooner. He was discharged on amiodarone, Xarelto and with a LifeVest. EP has been asked to see for recommendations regarding possible EPS +RF ablation since he is back in atrial flutter.   Past Medical History Past Medical History  Diagnosis Date  . Granular cell myoblastoma, malignant     a. myoblastoma  approx 1998, in remission. underwent gamma knife  procedure  . Tobacco abuse     16 pack history   . Chronic systolic CHF (congestive heart failure)     a. Echocardiogram  (09/01/13): Mild LVH, EF 30-35%, diffuse HK, mild AI mild MR, mild to moderate LAE, mild RAE, PASP 45  . Coronary artery disease     a. NSTEMI 08/2013 in setting of AFlutter with RVR (demand ischemia);  LHC (09/03/13): Distal left main 30, proximal LAD 40, mid LAD 40-50, mid AV groove circumflex 40, proximal OM1 30, ostial OM3  50, EF 20-25%.  Marland Kitchen NICM (nonischemic cardiomyopathy)   . Atrial flutter with rapid ventricular response   . Atrial fibrillation     Past Surgical History Past Surgical History  Procedure Laterality Date  . Knee surgery    . Hiatal hernia repair    . Gamma knife surgery      myoblastoma ~1998    Allergies/Intolerances Allergies  Allergen Reactions  . Acetaminophen     "shaky" "makes me angry"  . Peanut Butter Flavor     Breathing problems, swells lips    Inpatient Medications . amiodarone  200 mg Oral BID  . carvedilol  12.5 mg Oral BID WC  . furosemide  40 mg Oral Daily  . potassium chloride SA  20 mEq Oral Daily  . sodium chloride  3 mL Intravenous Q12H    Family History Family History  Problem Relation Age of Onset  . Heart attack Mother     alive     Social History History   Social History  . Marital Status: Single    Spouse Name: N/A    Number of Children: 2  . Years of Education: N/A   Occupational History  . Sales     Self employed    Social History Main Topics  . Smoking status: Current Every Day Smoker -- 1.00 packs/day for 16 years    Types: Cigarettes  . Smokeless tobacco: Not on file  . Alcohol Use: Yes  . Drug Use: No     Comment: marajuana occasionally   . Sexual Activity: Yes   Other  Topics Concern  . 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 He has 2 sons and lives with wife.      Review of Systems General: No chills, fever, night sweats or weight changes  Cardiovascular:  No chest pain, dyspnea on exertion, edema, orthopnea, palpitations, paroxysmal nocturnal dyspnea Dermatological: No rash, lesions or masses Respiratory: No cough, dyspnea Urologic: No hematuria, dysuria Abdominal: No nausea, vomiting, diarrhea, bright red blood per rectum, melena, or hematemesis Neurologic: No visual changes, weakness, changes in mental status All other systems reviewed and  are otherwise negative except as noted above.  Physical Exam Vitals: Blood pressure 104/62, pulse 147, temperature 98.4 F (36.9 C), temperature source Oral, resp. rate 18, SpO2 98.00%.  General: Well developed, well appearing 52 y.o. male in no acute distress. HEENT: Normocephalic, atraumatic. EOMs intact. Sclera nonicteric. Oropharynx clear.  Neck: Supple. No JVD. Lungs: Respirations regular and unlabored, CTA bilaterally. No wheezes, rales or rhonchi. Heart: Regular, tachycardic. S1, S2 present. No murmurs, rub, S3 or S4. Abdomen: Soft, non-tender, non-distended. BS present x 4 quadrants. No hepatosplenomegaly.  Extremities: No clubbing, cyanosis or edema. DP/PT/Radials 2+ and equal bilaterally. Psych: Normal affect. Neuro: Alert and oriented X 3. Moves all extremities spontaneously. Musculoskeletal: No kyphosis. Skin: Intact. Warm and dry. No rashes or petechiae in exposed areas.   Labs Admission labs pending  Radiology/Studies Chest x-ray pending  Echocardiogram  Study Conclusions - Left ventricle: The cavity size was moderately dilated. Wall thickness was increased in a pattern of mild LVH. Systolic function was moderately to severely reduced. The estimated ejection fraction was in the range of 30% to 35%. Diffuse hypokinesis. Left ventricular diastolic function parameters were normal. - Aortic valve: Mild regurgitation. - Mitral valve: Mild regurgitation. - Left atrium: The atrium was mildly to moderately dilated. - Right atrium: The atrium was mildly dilated. - Pulmonary arteries: PA peak pressure: 45mm Hg (S).  12-lead ECG - today from the office shows typical appearing atrial flutter at 118 bpm Telemetry - atrial flutter w/RVR, rates in 120s  Assessment and Plan 1. Atrial flutter 2. NICM, EF 20-25% 3. Chronic systolic HF 4. Nonobstructive CAD s/p cath 09/03/2013  5. Syncope  Justin Huerta presents with recurrent, typical appearing atrial flutter despite  amiodarone. This is newly diagnosed in setting of acute systolic HF and probable tachy-mediated cardiomyopathy identified during recent admission earlier this month. He meets criteria for EPS +RF ablation of atrial flutter. At the same time, EP study would also help to further evaluate electrical substrate for possible WPW as suggested by the very short PR interval, although there are no delta waves, which was present on ECG in SR from prior admission. Risks, benefits and alternatives to EP study and radiofrequency ablation were discussed. Dr. Ladona Ridgel to see.    Signed, Rick Duff, PA-C 09/21/2013, 2:21 PM   EP Attending  Patient seen and examined. Agree with the findings as noted by Janora Norlander, PA-c. The patient has medically refractory atrial flutter and LV dysfunction. I have discussed the treatment options with the patient and the risks/benefits/goals/expectations of catheter ablatio of atrial flutter have been discussed and he wishes to proceed.  Leonia Reeves.D.

## 2013-09-22 ENCOUNTER — Ambulatory Visit (HOSPITAL_COMMUNITY): Admit: 2013-09-22 | Payer: MEDICAID | Admitting: Internal Medicine

## 2013-09-22 ENCOUNTER — Encounter (HOSPITAL_COMMUNITY)
Admission: AD | Disposition: A | Discharge status: Home / Self Care | Payer: Self-pay | Source: Ambulatory Visit | Attending: Internal Medicine

## 2013-09-22 DIAGNOSIS — I4892 Unspecified atrial flutter: Secondary | ICD-10-CM

## 2013-09-22 HISTORY — PX: ABLATION: SHX5711

## 2013-09-22 HISTORY — PX: ATRIAL FLUTTER ABLATION: SHX5733

## 2013-09-22 LAB — BASIC METABOLIC PANEL
BUN: 27 mg/dL — ABNORMAL HIGH (ref 6–23)
Calcium: 8.2 mg/dL — ABNORMAL LOW (ref 8.4–10.5)
Creatinine, Ser: 1.55 mg/dL — ABNORMAL HIGH (ref 0.50–1.35)
GFR calc Af Amer: 57 mL/min — ABNORMAL LOW (ref >90)
GFR calc non Af Amer: 49 mL/min — ABNORMAL LOW (ref >90)
Glucose, Bld: 99 mg/dL (ref 70–99)

## 2013-09-22 LAB — PROTIME-INR: Prothrombin Time: 15.7 seconds — ABNORMAL HIGH (ref 11.6–15.2)

## 2013-09-22 SURGERY — ATRIAL FLUTTER ABLATION
Anesthesia: LOCAL

## 2013-09-22 MED ORDER — LIDOCAINE HCL (PF) 1 % IJ SOLN
INTRAMUSCULAR | Status: AC
  Filled 2013-09-22: qty 30

## 2013-09-22 MED ORDER — BUPIVACAINE HCL (PF) 0.25 % IJ SOLN
INTRAMUSCULAR | Status: AC
  Filled 2013-09-22: qty 30

## 2013-09-22 MED ORDER — ONDANSETRON HCL 4 MG/2ML IJ SOLN: 4.0000 mg | Freq: Four times a day (QID) | INTRAMUSCULAR | Status: DC | PRN

## 2013-09-22 MED ORDER — ACETAMINOPHEN 325 MG PO TABS: 650.0000 mg | ORAL_TABLET | ORAL | Status: DC | PRN

## 2013-09-22 MED ORDER — RIVAROXABAN 20 MG PO TABS
20.0000 mg | ORAL_TABLET | Freq: Every day | ORAL | Status: DC
  Administered 2013-09-22: 20 mg via ORAL
  Filled 2013-09-22: qty 1

## 2013-09-22 MED ORDER — RIVAROXABAN 20 MG PO TABS
20.0000 mg | ORAL_TABLET | Freq: Every day | ORAL | Status: DC
  Filled 2013-09-22: qty 1

## 2013-09-22 MED ORDER — DIAZEPAM 5 MG PO TABS
5.0000 mg | ORAL_TABLET | ORAL | Status: AC
  Administered 2013-09-22: 5 mg via ORAL
  Filled 2013-09-22: qty 1

## 2013-09-22 MED ORDER — HEPARIN (PORCINE) IN NACL 2-0.9 UNIT/ML-% IJ SOLN
INTRAMUSCULAR | Status: AC
  Filled 2013-09-22: qty 500

## 2013-09-22 MED ORDER — SODIUM CHLORIDE 0.9 % IJ SOLN: 3.0000 mL | INTRAMUSCULAR | Status: DC | PRN

## 2013-09-22 MED ORDER — POTASSIUM CHLORIDE CRYS ER 20 MEQ PO TBCR
40.0000 meq | EXTENDED_RELEASE_TABLET | Freq: Once | ORAL | Status: AC
  Administered 2013-09-22: 40 meq via ORAL
  Filled 2013-09-22: qty 2

## 2013-09-22 MED ORDER — MIDAZOLAM HCL 5 MG/5ML IJ SOLN
INTRAMUSCULAR | Status: AC
  Filled 2013-09-22: qty 5

## 2013-09-22 MED ORDER — SODIUM CHLORIDE 0.9 % IV SOLN: 250.0000 mL | INTRAVENOUS | Status: DC | PRN

## 2013-09-22 MED ORDER — SODIUM CHLORIDE 0.9 % IJ SOLN: 3.0000 mL | Freq: Two times a day (BID) | INTRAMUSCULAR | Status: DC

## 2013-09-22 MED ORDER — FENTANYL CITRATE 0.05 MG/ML IJ SOLN
INTRAMUSCULAR | Status: AC
  Filled 2013-09-22: qty 2

## 2013-09-22 NOTE — Progress Notes (Signed)
Utilization review completed.  

## 2013-09-22 NOTE — CV Procedure (Signed)
EPS/RFA of Atrial flutter without immediate complication. H#[id number].

## 2013-09-22 NOTE — Progress Notes (Signed)
   SUBJECTIVE: The patient is doing well today.  At this time, he denies chest pain, shortness of breath, or any new concerns. Pending flutter ablation today.   CURRENT MEDICATIONS: . amiodarone  200 mg Oral BID  . carvedilol  12.5 mg Oral BID WC  . furosemide  40 mg Oral Daily  . potassium chloride SA  20 mEq Oral Daily  . sodium chloride  3 mL Intravenous Q12H      OBJECTIVE: Physical Exam: Filed Vitals:   09/21/13 1300 09/21/13 2028 09/21/13 2112 09/22/13 0441  BP: 104/62 82/50 118/70 88/74  Pulse: 147 135 124 97  Temp: 98.4 F (36.9 C) 98.4 F (36.9 C)  97.4 F (36.3 C)  TempSrc: Oral Oral  Oral  Resp: 18 18  18   Weight:    202 lb 1.6 oz (91.672 kg)  SpO2: 98% 100%  100%   No intake or output data in the 24 hours ending 09/22/13 0647  Telemetry reveals atrial flutter, ventricular rates 120  GEN- The patient is well appearing, alert and oriented x 3 today.   Head- normocephalic, atraumatic Eyes-  Sclera clear, conjunctiva pink Ears- hearing intact Oropharynx- clear Neck- supple, no JVP Lymph- no cervical lymphadenopathy Lungs- Clear to ausculation bilaterally, normal work of breathing Heart- Regular rate and rhythm, no murmurs, rubs or gallops, PMI not laterally displaced GI- soft, NT, ND, + BS Extremities- no clubbing, cyanosis, or edema Skin- no rash or lesion Psych- euthymic mood, full affect Neuro- strength and sensation are intact  LABS: Basic Metabolic Panel:  Recent Labs  11/91/47 1432 09/22/13 0420  NA 139 138  K 3.4* 3.4*  CL 103 104  CO2 28 25  GLUCOSE 71 99  BUN 26* 27*  CREATININE 1.70* 1.55*  CALCIUM 8.6 8.2*   Liver Function Tests:  Recent Labs  09/21/13 1432  AST 14  ALT 13  ALKPHOS 57  BILITOT 0.4  PROT 6.8  ALBUMIN 3.1*   CBC:  Recent Labs  09/21/13 1432  WBC 4.5  NEUTROABS 2.6  HGB 13.8  HCT 39.6  MCV 100.0  PLT 156    ASSESSMENT AND PLAN:  Active Problems:   Atrial flutter with rapid ventricular  response  Rec; i have discussed the procedure with the patient and he wishes to proceed with catheter ablation of atrial flutter.  Leonia Reeves.D.

## 2013-09-22 NOTE — Discharge Summary (Signed)
ELECTROPHYSIOLOGY PROCEDURE DISCHARGE SUMMARY    Patient ID: Justin Huerta,  MRN: [id number], DOB/AGE: 04-12-1961 52 y.o.  Admit date: 09/21/2013 Discharge date: 09/23/2013  Primary Care Physician: Atlee Abide Primary Cardiologist: Tobias Alexander, MD Electrophysiologist: Sherryl Manges, MD  Primary Discharge Diagnosis:  Atrial flutter status post ablation this admission  Secondary Discharge Diagnosis:  1.  Non ischemic cardiomyopathy - dx in 08/2013 2.  Coronary artery disease - cath 08-2013 Distal left main 30, proximal LAD 40, mid LAD 40-50, mid AV groove circumflex 40, proximal OM1 30, ostial OM3 50, EF 20-25%.  3.  Granular cell myoblastoma, in remission  Allergies  Allergen Reactions  . Acetaminophen     "shaky" "makes me angry"  . Peanut Butter Flavor     Breathing problems, swells lips     Procedures This Admission:  1.  Electrophysiology study and radiofrequency catheter ablation of atrial flutter on 09-22-2013 by Dr Ladona Ridgel.  Complete bidirectional isthmus block was achieved.  There were no inducible arrhythmias following ablation and no early apparent complications.   Brief HPI/Hosp Course: Justin Huerta is a 52 y.o. male with newly diagnosed atrial flutter, NICM, EF 20-25%, nonob CAD and myoblastoma in remission who presented to the office today for hospital follow-up. He reported fatigue, chest "burning" pain, intermittent SOB and increased LE swelling. He was found to have recurrent atrial flutter w/RVR and was then admitted to [facility]. Justin Huerta reports his symptoms started about 3 days ago. He denies palpitations or syncope. During his recent admission 09/01/2013 - 09/06/2013, he spontaneously converted to SR. There was some concern for WPW as he had a shortened PR interval but no Delta wave was present. Dr. Graciela Husbands deferred EPS +RF ablation to allow his ejection fraction to improve to limit periprocedural complications. However, if recurrent atrial flutter,  would need ablation sooner. He was discharged on amiodarone, Xarelto and with a LifeVest. EP was asked to consult for appropriateness of flutter ablation.  Risks, benefits, and alternatives were reviewed with the patient who wished to proceed.  The patient underwent ablation by Dr Ladona Ridgel on 09-22-2013 with details as outlined above.  He was monitored on telemetry after his bedrest which demonstrated sinus rhythm.  Post bedrest, he was ambulating without difficulty and his groin was without complication.  He was considered stable for discharge to home.  He will be maintained on Xarelto for 4 weeks following ablation.  He will have reassessment of his EF in 6-12 weeks to evaluate for recovery of LV function.  Due to successful ablation, his Amiodarone was discontinued at the time of discharge.   Discharge Vitals: Blood pressure 130/96, pulse 86, temperature 97.4 F (36.3 C), temperature source Oral, resp. rate 18, height 5\' 11"  (1.803 m), weight 202 lb 1.6 oz (91.672 kg), SpO2 100.00%.    Labs:   Lab Results  Component Value Date   WBC 4.5 09/21/2013   HGB 13.8 09/21/2013   HCT 39.6 09/21/2013   MCV 100.0 09/21/2013   PLT 156 09/21/2013     Recent Labs Lab 09/21/13 1432 09/22/13 0420  NA 139 138  K 3.4* 3.4*  CL 103 104  CO2 28 25  BUN 26* 27*  CREATININE 1.70* 1.55*  CALCIUM 8.6 8.2*  PROT 6.8  --   BILITOT 0.4  --   ALKPHOS 57  --   ALT 13  --   AST 14  --   GLUCOSE 71 99   Lab Results  Component Value Date  CKTOTAL 374* 09/01/2013   CKMB 7.8* 09/01/2013   TROPONINI 0.34* 09/01/2013    Lab Results  Component Value Date   CHOL 163 09/02/2013   Lab Results  Component Value Date   HDL 35* 09/02/2013   Lab Results  Component Value Date   LDLCALC 111* 09/02/2013   Lab Results  Component Value Date   TRIG 86 09/02/2013   Lab Results  Component Value Date   CHOLHDL 4.7 09/02/2013   No results found for this basename: LDLDIRECT   Lab Results  Component Value Date    DDIMER 1.65* 09/01/2013     Discharge Medications:    Medication List    STOP taking these medications       amiodarone 200 MG tablet  Commonly known as:  PACERONE      TAKE these medications       carvedilol 12.5 MG tablet  Commonly known as:  COREG  Take 1 tablet (12.5 mg total) by mouth 2 (two) times daily with a meal.     furosemide 40 MG tablet  Commonly known as:  LASIX  Take 1 tablet (40 mg total) by mouth daily.     lisinopril 5 MG tablet  Commonly known as:  PRINIVIL,ZESTRIL  Take 1 tablet (5 mg total) by mouth daily.     potassium chloride SA 20 MEQ tablet  Commonly known as:  K-DUR,KLOR-CON  Take 1 tablet (20 mEq total) by mouth daily.     Rivaroxaban 20 MG Tabs tablet  Commonly known as:  XARELTO  Take 1 tablet (20 mg total) by mouth daily with supper.        Disposition:   Future Appointments Provider Department Dept Phone   09/30/2013 2:30 PM Doris Cheadle, MD [facility] [phone]   10/20/2013 3:30 PM Minda Meo, PA-C [facility] Office [phone]     Follow-up Information   Follow up with Rick Duff, PA-C On 10/20/2013. (3:30 PM)    Specialty:  Cardiology   Contact information:   [address] [place] [address] [phone]       Duration of Discharge Encounter: Greater than 30 minutes including physician time.

## 2013-09-22 NOTE — Discharge Instructions (Addendum)
Information on my medicine - XARELTO (Rivaroxaban)  Why was Xarelto prescribed for you? Xarelto was prescribed for you to reduce the risk of a blood clots forming to reduce the risk of forming blood clots that cause a stroke if you have a medical condition called atrial fibrillation (a type of irregular heartbeat).  What do you need to know about xarelto ? Take your Xarelto ONCE DAILY at the same time every day with your evening meal. If you have difficulty swallowing the tablet whole, you may crush it and mix in applesauce just prior to taking your dose.  Take Xarelto exactly as prescribed by your doctor and DO NOT stop taking Xarelto without talking to the doctor who prescribed the medication.  Stopping without other stroke or VTE prevention medication to take the place of Xarelto may increase your risk of developing a new clot or stroke.  Refill your prescription before you run out.  After discharge, you should have regular check-up appointments with your healthcare provider that is prescribing your Xarelto.  In the future your dose may need to be changed if your kidney function or weight changes by a significant amount.  What do you do if you miss a dose? If you are taking Xarelto ONCE DAILY and you miss a dose, take it as soon as you remember on the same day then continue your regularly scheduled once daily regimen the next day. Do not take two doses of Xarelto at the same time.   Important Safety Information A possible side effect of Xarelto is bleeding. You should call your healthcare provider right away if you experience any of the following:   Bleeding from an injury or your nose that does not stop.   Unusual colored urine (red or dark brown) or unusual colored stools (red or black).   Unusual bruising for unknown reasons.   A serious fall or if you hit your head (even if there is no bleeding).  Some medicines may interact with Xarelto and might increase your risk of  bleeding while on Xarelto. To help avoid this, consult your healthcare provider or pharmacist prior to using any new prescription or non-prescription medications, including herbals, vitamins, non-steroidal anti-inflammatory drugs (NSAIDs) and supplements.  This website has more information on Xarelto: VisitDestination.com.br.     Groin Site Care Refer to this sheet in the next few weeks. These instructions provide you with information on caring for yourself after your procedure. Your caregiver may also give you more specific instructions. Your treatment has been planned according to current medical practices, but problems sometimes occur. Call your caregiver if you have any problems or questions after your procedure. HOME CARE INSTRUCTIONS  You may shower 24 hours after the procedure. Remove the bandage (dressing) and gently wash the site with plain soap and water. Gently pat the site dry.  Do not apply powder or lotion to the site.  Do not sit in a bathtub, swimming pool, or whirlpool for 5 to 7 days.  No bending, squatting, or lifting anything over 10 pounds (4.5 kg) as directed by your caregiver.  Inspect the site at least twice daily.  Do not drive home if you are discharged the same day of the procedure. Have someone else drive you.  You may drive 24 hours after the procedure unless otherwise instructed by your caregiver. What to expect:  Any bruising will usually fade within 1 to 2 weeks.  Blood that collects in the tissue (hematoma) may be painful to the touch.  It should usually decrease in size and tenderness within 1 to 2 weeks. SEEK IMMEDIATE MEDICAL CARE IF:  You have unusual pain at the groin site or down the affected leg.  You have redness, warmth, swelling, or pain at the groin site.  You have drainage (other than a small amount of blood on the dressing).  You have chills.  You have a fever or persistent symptoms for more than 72 hours.  You have a fever and your  symptoms suddenly get worse.  Your leg becomes pale, cool, tingly, or numb.  You have heavy bleeding from the site. Hold pressure on the site. Document Released: 10/20/2010 Document Revised: 12/10/2011 Document Reviewed: 10/20/2010 [facility] Patient Information 2014 [place], [place].  Groin Site Care Refer to this sheet in the next few weeks. These instructions provide you with information on caring for yourself after your procedure. Your caregiver may also give you more specific instructions. Your treatment has been planned according to current medical practices, but problems sometimes occur. Call your caregiver if you have any problems or questions after your procedure. HOME CARE INSTRUCTIONS  You may shower 24 hours after the procedure. Remove the bandage (dressing) and gently wash the site with plain soap and water. Gently pat the site dry.  Do not apply powder or lotion to the site.  Do not sit in a bathtub, swimming pool, or whirlpool for 5 to 7 days.  No bending, squatting, or lifting anything over 10 pounds (4.5 kg) as directed by your caregiver.  Inspect the site at least twice daily.  Do not drive home if you are discharged the same day of the procedure. Have someone else drive you.  You may drive 24 hours after the procedure unless otherwise instructed by your caregiver. What to expect:  Any bruising will usually fade within 1 to 2 weeks.  Blood that collects in the tissue (hematoma) may be painful to the touch. It should usually decrease in size and tenderness within 1 to 2 weeks. SEEK IMMEDIATE MEDICAL CARE IF:  You have unusual pain at the groin site or down the affected leg.  You have redness, warmth, swelling, or pain at the groin site.  You have drainage (other than a small amount of blood on the dressing).  You have chills.  You have a fever or persistent symptoms for more than 72 hours.  You have a fever and your symptoms suddenly get worse.  Your leg  becomes pale, cool, tingly, or numb.  You have heavy bleeding from the site. Hold pressure on the site. Document Released: 10/20/2010 Document Revised: 12/10/2011 Document Reviewed: 10/20/2010 [facility] Patient Information 2014 [place], [place].

## 2013-09-23 NOTE — Op Note (Signed)
NAMELOWELL, MAKARA NO.:  [id number]  MEDICAL RECORD NO.:  [id number]  LOCATION:  [id number]                        FACILITY:  [facility]  PHYSICIAN:  Doylene Canning. Ladona Ridgel, MD    DATE OF BIRTH:  04-05-1961  DATE OF PROCEDURE:  09/22/2013 DATE OF DISCHARGE:  09/22/2013                              OPERATIVE REPORT   PROCEDURE PERFORMED:  Electrophysiologic study and RF catheter ablation of atrial flutter.  INTRODUCTION:  The patient is a 52 year old man with a history of recurrent tachy palpitations and documented atrial flutter.  He has a known nonischemic cardiomyopathy, questionable tachycardia induced and ejection fraction of 35%.  He presented to the office yesterday with atrial flutter and rapid ventricular response associated with shortness of breath and has been admitted for catheter ablation.  DESCRIPTION OF PROCEDURE:  After informed was obtained, the patient was taken to the diagnostic EP lab in a fasting state.  After usual preparation and draping, intravenous fentanyl and midazolam was given for sedation.  A 6-French hexapolar catheter was inserted percutaneously into the right jugular vein and advanced into the coronary sinus.  A 6- French quadripolar catheter was inserted percutaneously in the right femoral vein and advanced to the right ventricle and His bundle regions. A 7-French quadripolar ablation catheter was inserted percutaneously into the right femoral vein and advanced into the right atrium.  Mapping was carried out.  Demonstrated typical counterclockwise tricuspid annular reentrant atrial flutter, cycle length 260 milliseconds.  The ablation catheter was maneuvered into the atrial flutter isthmus, and a total of 4 RF energy applications were delivered for approximately 15 minutes.  During the second RF energy application, atrial flutter was terminated and sinus rhythm restored.  During the third RF energy application, atrial flutter isthmus block  was demonstrated and a fourth bonus RF energy application was delivered.  At this point, the ablation catheter was maneuvered into the right ventricle and rapid ventricular pacing was carried out and stepwise decreased down to AV Wenckebach at 290 milliseconds.  During rapid ventricular pacing, the atrial activation was midline and decremental.  There were no inducible arrhythmias.  Programmed ventricular stimulation was then carried out from the right ventricle at base drive cycle length of 161 milliseconds. The S1-S2 interval stepwise decreased down to 260 milliseconds where ventricular refractoriness was observed.  During programmed ventricular stimulation, the atrial activation sequence was midline and decremental. Next, programmed atrial stimulation was carried out from the atrium at base drive cycle length of 096 milliseconds.  The S1-S2 interval stepwise decreased from 440 milliseconds down to 209 milliseconds where the AV node ERP was observed.  During programmed atrial stimulation, there were no AH jumps, no echo beats, and no inducible SVT.  Finally, rapid atrial pacing was carried out from the atrium at a base drive cycle length of 045 milliseconds stepwise decreased down to 290 milliseconds where AV Wenckebach was observed.  During rapid atrial pacing, the PR interval was less than the RR interval and there was no inducible SVT.  At this point, additional rapid atrial pacing was carried out from the coronary sinus at 600 milliseconds and this was demonstrated residual atrial flutter isthmus block.  The stimulation to  atrial activation time locally was 159 milliseconds.  The catheter was then removed.  Hemostasis was assured and the patient was returned to his room in satisfactory condition.  COMPLICATIONS:  There were no immediate procedure complications.  RESULTS:  A.  Baseline ECG.  Baseline ECG demonstrates atrial flutter with 2:1 AV conduction. B.  Baseline intervals.   The atrial flutter cycle length was 260 millisecond, the HV interval was 50 milliseconds.  QRS duration was 92 milliseconds.  Following ablation, the HV interval remained 50 milliseconds, the AH interval 64 milliseconds, and the HV interval was 48 milliseconds.  The QRS duration was 92 milliseconds. C.  Rapid ventricular pacing:  Following catheter ablation, rapid ventricular pacing was carried out from the right ventricle demonstrated VA Wenckebach cycle length of 290 milliseconds.  During rapid ventricular pacing, the atrial activation sequence was midline and decremental. D.  Programmed ventricular stimulation:  Programmed ventricular stimulation was carried out from the right ventricle at base drive cycle length of 161 milliseconds.  The S1-S2 interval was stepwise decreased down to 260 milliseconds where the ventricular refractoriness was observed.  During programmed ventricular stimulation, the atrial activation was midline and decremental. E.  Programmed atrial stimulation:  Programmed atrial stimulation was carried out from the atrium at a base drive cycle length of 096 milliseconds.  The S1-S2 interval stepwise decreased down to 290 milliseconds where the AV node ERP was observed.  During programmed atrial stimulation, there were no AH jumps, no echo beats, and no inducible SVT. F.  Rapid atrial pacing:  Rapid atrial pacing was carried out from the atrium at a base drive cycle length of 045 milliseconds and stepwise decreased down to 209 milliseconds where AV Wenckebach was observed. During rapid atrial pacing, the PR interval was less than the RR interval and there was no inducible SVT. G.  Arrhythmias observed:  Atrial flutter initiation present at the time of EP study.  The duration was sustained.  Cycle length was 265 milliseconds.  Method of termination was with catheter ablation. H.  Mapping:  Mapping of atrial flutter isthmus demonstrated typical counterclockwise  tricuspid annular reentry. 1. RF energy application:  A total of 4 RF energy applications for     ablation time of approximately 15 minutes was carried out between 5     o'clock and 7 o'clock in the atrial flutter isthmus resulting in     termination of atrial flutter, restoration of sinus rhythm,     creation of bidirectional block and atrial flutter isthmus.  CONCLUSION:  This study demonstrates successful electrophysiologic study and RF catheter ablation of typical atrial flutter with 4 RF energy applications delivered to the atrial flutter isthmus resulting in termination of atrial flutter, restoration of sinus rhythm, and creation of bidirectional block and atrial flutter isthmus.     Doylene Canning. Ladona Ridgel, MD     GWT/MEDQ  D:  09/22/2013  T:  09/22/2013  Job:  [id number]  cc:   Bevelyn Buckles. Bensimhon, MD Pricilla Riffle, MD, [facility]

## 2013-09-25 ENCOUNTER — Encounter (HOSPITAL_COMMUNITY): Payer: Self-pay | Admitting: *Deleted

## 2013-09-30 ENCOUNTER — Encounter: Payer: Self-pay | Admitting: Internal Medicine

## 2013-09-30 ENCOUNTER — Telehealth: Payer: Self-pay | Admitting: Physician Assistant

## 2013-09-30 ENCOUNTER — Other Ambulatory Visit: Payer: Self-pay | Admitting: *Deleted

## 2013-09-30 ENCOUNTER — Ambulatory Visit: Payer: Medicaid Other | Attending: Internal Medicine | Admitting: Internal Medicine

## 2013-09-30 VITALS — BP 126/86 | HR 54 | Temp 98.0°F | Resp 16 | Wt 193.8 lb

## 2013-09-30 DIAGNOSIS — I2510 Atherosclerotic heart disease of native coronary artery without angina pectoris: Secondary | ICD-10-CM | POA: Diagnosis not present

## 2013-09-30 DIAGNOSIS — I428 Other cardiomyopathies: Secondary | ICD-10-CM

## 2013-09-30 DIAGNOSIS — I10 Essential (primary) hypertension: Secondary | ICD-10-CM | POA: Diagnosis not present

## 2013-09-30 DIAGNOSIS — I4891 Unspecified atrial fibrillation: Secondary | ICD-10-CM | POA: Insufficient documentation

## 2013-09-30 DIAGNOSIS — Z139 Encounter for screening, unspecified: Secondary | ICD-10-CM

## 2013-09-30 DIAGNOSIS — Z23 Encounter for immunization: Secondary | ICD-10-CM

## 2013-09-30 DIAGNOSIS — Z720 Tobacco use: Secondary | ICD-10-CM

## 2013-09-30 DIAGNOSIS — E876 Hypokalemia: Secondary | ICD-10-CM

## 2013-09-30 DIAGNOSIS — F17200 Nicotine dependence, unspecified, uncomplicated: Secondary | ICD-10-CM

## 2013-09-30 LAB — COMPLETE METABOLIC PANEL WITH GFR
ALT: 16 U/L (ref 0–53)
AST: 21 U/L (ref 0–37)
Albumin: 3.7 g/dL (ref 3.5–5.2)
Alkaline Phosphatase: 53 U/L (ref 39–117)
BUN: 15 mg/dL (ref 6–23)
Calcium: 8.4 mg/dL (ref 8.4–10.5)
Chloride: 99 mEq/L (ref 96–112)
Creat: 1.27 mg/dL (ref 0.50–1.35)
GFR, Est Non African American: 64 mL/min
Glucose, Bld: 89 mg/dL (ref 70–99)

## 2013-09-30 LAB — TSH: TSH: 0.645 u[IU]/mL (ref 0.350–4.500)

## 2013-09-30 MED ORDER — NICOTINE 21 MG/24HR TD PT24: 21.0000 mg | MEDICATED_PATCH | Freq: Every day | TRANSDERMAL | Status: DC

## 2013-09-30 MED ORDER — CARVEDILOL 12.5 MG PO TABS: 12.5000 mg | ORAL_TABLET | Freq: Two times a day (BID) | ORAL | Status: DC

## 2013-09-30 MED ORDER — FUROSEMIDE 40 MG PO TABS: 40.0000 mg | ORAL_TABLET | Freq: Every day | ORAL | Status: DC

## 2013-09-30 MED ORDER — LISINOPRIL 5 MG PO TABS: 5.0000 mg | ORAL_TABLET | Freq: Every day | ORAL | Status: DC

## 2013-09-30 MED ORDER — POTASSIUM CHLORIDE CRYS ER 20 MEQ PO TBCR: 20.0000 meq | EXTENDED_RELEASE_TABLET | Freq: Every day | ORAL | Status: DC

## 2013-09-30 NOTE — Telephone Encounter (Signed)
This was an after hours outgoing phone call to Justin Huerta, who had called with a question about wearing his life vest. Patient recently had a atrial flutter ablation and wanted to know if he should continue wearing his life vest. We instructed him to wear his life vest unless told to do otherwise at his next cardiologist visit. He verbalized understanding.   Madgie Dhaliwal Stern PA-C  [facility]   

## 2013-09-30 NOTE — Progress Notes (Signed)
Patient is here for follow up from hospital Was in hospital for heart failure A-Fib  Takes medication for Hypertension as well

## 2013-09-30 NOTE — Telephone Encounter (Signed)
This was an after hours outgoing phone call to Justin Huerta, who had called with a question about wearing his life vest. Patient recently had a atrial flutter ablation and wanted to know if he should continue wearing his life vest. We instructed him to wear his life vest unless told to do otherwise at his next cardiologist visit. He verbalized understanding.   Thereasa Parkin PA-C  [facility]

## 2013-09-30 NOTE — Progress Notes (Signed)
Patient Demographics  Justin Huerta, is a 52 y.o. male  UJW:[id number]  FAO:[id number]  DOB - [birth year]  CC:  Chief Complaint  Patient presents with  . Hospitalization Follow-up       HPI: Justin Huerta is a 52 y.o. male here today to establish medical care.he has  past medical history of hypertension, CAD, nonischemic cardiomyopathy, atrial flutter with RVR status post ablation patient following up with cardiology. Patient has been taking his medications, his previous blood work reviewed noticed hypokalemia patient is taking potassium supplement, patient denies any exertional chest pain or shortness of breath but reported to feeling tired after walking 3 or 4 blocks denies any orthopnea has occasional leg swelling. patient does smoke cigarettes, advised to quit smoking he is going to try nicotine patch. Patient has No headache, No chest pain, No abdominal pain - No Nausea, No new weakness tingling or numbness, No Cough - SOB.  Allergies  Allergen Reactions  . Acetaminophen     "shaky" "makes me angry"  . Peanut Butter Flavor     Breathing problems, swells lips   Past Medical History  Diagnosis Date  . Granular cell myoblastoma, malignant     a. myoblastoma  approx 1998, in remission. underwent gamma knife  procedure  . Tobacco abuse     16 pack history   . Chronic systolic CHF (congestive heart failure)     a. Echocardiogram  (09/01/13): Mild LVH, EF 30-35%, diffuse HK, mild AI mild MR, mild to moderate LAE, mild RAE, PASP 45  . Coronary artery disease     a. NSTEMI 08/2013 in setting of AFlutter with RVR (demand ischemia);  LHC (09/03/13): Distal left main 30, proximal LAD 40, mid LAD 40-50, mid AV groove circumflex 40, proximal OM1 30, ostial OM3 50, EF 20-25%.  Marland Kitchen NICM (nonischemic cardiomyopathy)   . Atrial flutter with rapid ventricular response   . Atrial fibrillation   . Seizures 2005-2006    "thought from myoblastoma" (09/21/2013)  . Pneumonia ~ 1964  . Exertional  shortness of breath   . H/O hiatal hernia   . GERD (gastroesophageal reflux disease)   . ONGEXBMW(413.2)     "weekly" (09/21/2013)  . Anxiety    Current Outpatient Prescriptions on File Prior to Visit  Medication Sig Dispense Refill  . carvedilol (COREG) 12.5 MG tablet Take 1 tablet (12.5 mg total) by mouth 2 (two) times daily with a meal.  60 tablet  0  . furosemide (LASIX) 40 MG tablet Take 1 tablet (40 mg total) by mouth daily.  30 tablet  0  . lisinopril (PRINIVIL,ZESTRIL) 5 MG tablet Take 1 tablet (5 mg total) by mouth daily.  30 tablet  0  . Rivaroxaban (XARELTO) 20 MG TABS tablet Take 1 tablet (20 mg total) by mouth daily with supper.  30 tablet  0  . potassium chloride SA (K-DUR,KLOR-CON) 20 MEQ tablet Take 1 tablet (20 mEq total) by mouth daily.  30 tablet  0   No current facility-administered medications on file prior to visit.   Family History  Problem Relation Age of Onset  . Heart attack Mother     alive  . Heart disease Mother   . Hypertension Mother    History   Social History  . Marital Status: Single    Spouse Name: N/A    Number of Children: 2  . Years of Education: N/A   Occupational History  . Sales     Self employed    Social History  Main Topics  . Smoking status: Current Every Day Smoker -- 0.12 packs/day for 16 years    Types: Cigarettes  . Smokeless tobacco: Never Used  . Alcohol Use: Yes     Comment: 09/21/2013 "probably 2 drinks/month"  . Drug Use: Yes    Special: Marijuana     Comment: 09/21/2013 "used to smoke marijuana twice/wk; since 08/31/2013 I don't smoke it at all"  . Sexual Activity: Yes   Other Topics Concern  . 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 He has 2 sons and lives with wife.     Review of Systems: Constitutional: Negative for fever, chills, diaphoresis, activity change, appetite change and +fatigue. HENT: Negative for ear pain, nosebleeds,  congestion, facial swelling, rhinorrhea, neck pain, neck stiffness and ear discharge.  Eyes: Negative for pain, discharge, redness, itching and visual disturbance. Respiratory: Negative for cough, choking, chest tightness, shortness of breath, wheezing and stridor.  Cardiovascular: Negative for chest pain, palpitations and leg swelling. Gastrointestinal: Negative for abdominal distention. Genitourinary: Negative for dysuria, urgency, frequency, hematuria, flank pain, decreased urine volume, difficulty urinating and dyspareunia.  Musculoskeletal: Negative for back pain, joint swelling, arthralgia and gait problem. Neurological: Negative for dizziness, tremors, seizures, syncope, facial asymmetry, speech difficulty, weakness, light-headedness, numbness and headaches.  Hematological: Negative for adenopathy. Does not bruise/bleed easily. Psychiatric/Behavioral: Negative for hallucinations, behavioral problems, confusion, dysphoric mood, decreased concentration and agitation.    Objective:   Filed Vitals:   09/30/13 1019  BP: 126/86  Pulse: 54  Temp: 98 F (36.7 C)  Resp: 16    Physical Exam: Constitutional: Patient appears well-developed and well-nourished. No distress. HENT: Normocephalic, atraumatic, External right and left ear normal. Oropharynx is clear and moist.  Eyes: Conjunctivae and EOM are normal. PERRLA, no scleral icterus. Neck: Normal ROM. Neck supple. No JVD. No tracheal deviation. No thyromegaly. CVS: RRR, S1/S2 +, no murmurs, no gallops. Pulmonary: Effort and breath sounds normal, no stridor, rhonchi, wheezes, rales.  Abdominal: Soft. BS +, no distension, tenderness, rebound or guarding.  Musculoskeletal: Normal range of motion. No edema and no tenderness.  Skin: Skin is warm and dry. No rash noted. Not diaphoretic. No erythema. No pallor. Psychiatric: Normal mood and affect. Behavior, judgment, thought content normal.  Lab Results  Component Value Date   WBC 4.5  09/21/2013   HGB 13.8 09/21/2013   HCT 39.6 09/21/2013   MCV 100.0 09/21/2013   PLT 156 09/21/2013   Lab Results  Component Value Date   CREATININE 1.55* 09/22/2013   BUN 27* 09/22/2013   NA 138 09/22/2013   K 3.4* 09/22/2013   CL 104 09/22/2013   CO2 25 09/22/2013    Lab Results  Component Value Date   HGBA1C 5.7* 09/02/2013   Lipid Panel     Component Value Date/Time   CHOL 163 09/02/2013 0940   TRIG 86 09/02/2013 0940   HDL 35* 09/02/2013 0940   CHOLHDL 4.7 09/02/2013 0940   VLDL 17 09/02/2013 0940   LDLCALC 111* 09/02/2013 0940       Assessment and plan:   1. HTN (hypertension) Controlled  - COMPLETE METABOLIC PANEL WITH GFR  2. CAD (coronary artery disease) / afib with RVR s/p ablation  3. NICM (nonischemic cardiomyopathy)  Patient currently following up with cardiology on xarelto has scheduled apt next month.   4. Tobacco abuse Patient will try patch to quit smoking  - nicotine (NICODERM CQ) 21  mg/24hr patch; Place 1 patch (21 mg total) onto the skin daily.  Dispense: 28 patch; Refill: 0  5. Hypokalemia Patient is  on potassium  Supplements Will repeat chemistry   - COMPLETE METABOLIC PANEL WITH GFR  6. Screening Will check  - TSH - Vit D  25 hydroxy (rtn osteoporosis monitoring)  7. Needs flu shot Given today        Health Maintenance -flu shot today   Follow up in 6 weeks     Stanislawa Gaffin, Ayesha Rumpf, MD

## 2013-10-01 LAB — VITAMIN D 25 HYDROXY (VIT D DEFICIENCY, FRACTURES): Vit D, 25-Hydroxy: 23 ng/mL — ABNORMAL LOW (ref 30–89)

## 2013-10-05 ENCOUNTER — Telehealth: Payer: Self-pay

## 2013-10-05 ENCOUNTER — Telehealth: Payer: Self-pay | Admitting: *Deleted

## 2013-10-05 NOTE — Telephone Encounter (Signed)
Message copied by Joan Mayans on Mon Oct 05, 2013 12:11 PM ------      Message from: [name]      Created: Mon Oct 05, 2013 11:37 AM       Blood work reviewed noticed low vitamin D, advise patient to start taking over-the-counter 2000 units daily. ------

## 2013-10-05 NOTE — Telephone Encounter (Signed)
Message copied by Dorothe Pea on Mon Oct 05, 2013  2:42 PM ------      Message from: [name]      Created: Mon Oct 05, 2013 11:37 AM       Blood work reviewed noticed low vitamin D, advise patient to start taking over-the-counter 2000 units daily. ------

## 2013-10-05 NOTE — Telephone Encounter (Signed)
I spoke to pt and informed him of his labs and the findings of low vitamin D. I advise patient to start taking over-the-counter 2000 units daily

## 2013-10-11 ENCOUNTER — Emergency Department (HOSPITAL_COMMUNITY)
Admission: EM | Admit: 2013-10-11 | Discharge: 2013-10-11 | Disposition: A | Discharge status: Home / Self Care | Payer: Medicaid Other | Attending: Emergency Medicine | Admitting: Emergency Medicine

## 2013-10-11 ENCOUNTER — Emergency Department (HOSPITAL_COMMUNITY): Payer: Medicaid Other

## 2013-10-11 ENCOUNTER — Encounter (HOSPITAL_COMMUNITY): Payer: Self-pay | Admitting: Emergency Medicine

## 2013-10-11 DIAGNOSIS — Z8701 Personal history of pneumonia (recurrent): Secondary | ICD-10-CM | POA: Diagnosis not present

## 2013-10-11 DIAGNOSIS — Z7901 Long term (current) use of anticoagulants: Secondary | ICD-10-CM | POA: Insufficient documentation

## 2013-10-11 DIAGNOSIS — F17200 Nicotine dependence, unspecified, uncomplicated: Secondary | ICD-10-CM | POA: Diagnosis not present

## 2013-10-11 DIAGNOSIS — I4891 Unspecified atrial fibrillation: Secondary | ICD-10-CM | POA: Diagnosis not present

## 2013-10-11 DIAGNOSIS — I2510 Atherosclerotic heart disease of native coronary artery without angina pectoris: Secondary | ICD-10-CM | POA: Diagnosis not present

## 2013-10-11 DIAGNOSIS — F411 Generalized anxiety disorder: Secondary | ICD-10-CM | POA: Diagnosis not present

## 2013-10-11 DIAGNOSIS — R0602 Shortness of breath: Secondary | ICD-10-CM | POA: Diagnosis not present

## 2013-10-11 DIAGNOSIS — Z8669 Personal history of other diseases of the nervous system and sense organs: Secondary | ICD-10-CM | POA: Diagnosis not present

## 2013-10-11 DIAGNOSIS — I5022 Chronic systolic (congestive) heart failure: Secondary | ICD-10-CM | POA: Insufficient documentation

## 2013-10-11 DIAGNOSIS — R079 Chest pain, unspecified: Secondary | ICD-10-CM | POA: Diagnosis not present

## 2013-10-11 DIAGNOSIS — Z79899 Other long term (current) drug therapy: Secondary | ICD-10-CM | POA: Insufficient documentation

## 2013-10-11 DIAGNOSIS — I252 Old myocardial infarction: Secondary | ICD-10-CM | POA: Insufficient documentation

## 2013-10-11 DIAGNOSIS — Z8719 Personal history of other diseases of the digestive system: Secondary | ICD-10-CM | POA: Insufficient documentation

## 2013-10-11 DIAGNOSIS — M549 Dorsalgia, unspecified: Secondary | ICD-10-CM | POA: Diagnosis not present

## 2013-10-11 DIAGNOSIS — Z8589 Personal history of malignant neoplasm of other organs and systems: Secondary | ICD-10-CM | POA: Insufficient documentation

## 2013-10-11 LAB — BASIC METABOLIC PANEL
BUN: 16 mg/dL (ref 6–23)
CALCIUM: 9.1 mg/dL (ref 8.4–10.5)
CHLORIDE: 98 meq/L (ref 96–112)
CO2: 26 mEq/L (ref 19–32)
Creatinine, Ser: 1.19 mg/dL (ref 0.50–1.35)
GFR calc Af Amer: 78 mL/min — ABNORMAL LOW (ref >90)
GFR calc non Af Amer: 68 mL/min — ABNORMAL LOW (ref >90)
Glucose, Bld: 82 mg/dL (ref 70–99)
Potassium: 3.5 mEq/L — ABNORMAL LOW (ref 3.7–5.3)
SODIUM: 138 meq/L (ref 137–147)

## 2013-10-11 LAB — CBC
HCT: 37.8 % — ABNORMAL LOW (ref 39.0–52.0)
Hemoglobin: 13.3 g/dL (ref 13.0–17.0)
MCH: 34.3 pg — ABNORMAL HIGH (ref 26.0–34.0)
MCHC: 35.2 g/dL (ref 30.0–36.0)
MCV: 97.4 fL (ref 78.0–100.0)
PLATELETS: 268 10*3/uL (ref 150–400)
RBC: 3.88 MIL/uL — ABNORMAL LOW (ref 4.22–5.81)
RDW: 12 % (ref 11.5–15.5)
WBC: 4.8 10*3/uL (ref 4.0–10.5)

## 2013-10-11 LAB — POCT I-STAT TROPONIN I
TROPONIN I, POC: 0.02 ng/mL (ref 0.00–0.08)
TROPONIN I, POC: 0.03 ng/mL (ref 0.00–0.08)

## 2013-10-11 LAB — PRO B NATRIURETIC PEPTIDE: PRO B NATRI PEPTIDE: 1376 pg/mL — AB (ref 0–125)

## 2013-10-11 MED ORDER — MORPHINE SULFATE 4 MG/ML IJ SOLN: 4.0000 mg | Freq: Once | INTRAMUSCULAR | Status: DC

## 2013-10-11 NOTE — ED Notes (Signed)
Pt c/o left sided chest pressure with shortness of breath and pain to left upper back area. Pt with labored breathing at present. Pt has a life vest. Pt was hospitalized 08/31/2013 and had ablation on 09/21/2013.

## 2013-10-11 NOTE — ED Notes (Signed)
REport received and given to EDP

## 2013-10-11 NOTE — ED Notes (Signed)
[facility] called for interrogation report per MD request.

## 2013-10-11 NOTE — ED Notes (Signed)
Pt report swelling to to ends of his fingers and "pins and needles" feeling. MD aware.

## 2013-10-11 NOTE — ED Provider Notes (Signed)
CSN: [id number]     Arrival date & time 10/11/13  1810 History   First MD Initiated Contact with Patient 10/11/13 1829     Chief Complaint  Patient presents with  . Chest Pain  . Shortness of Breath  . Back Pain   (Consider location/radiation/quality/duration/timing/severity/associated sxs/prior Treatment) Patient is a 53 y.o. male presenting with chest pain.  Chest Pain Pain location:  L chest Pain quality: pressure   Pain radiates to:  Does not radiate Pain radiates to the back: no   Pain severity:  Moderate Onset quality:  Gradual Duration:  1 day Progression:  Partially resolved Chronicity:  Recurrent Relieved by:  Nothing Associated symptoms: shortness of breath   Associated symptoms: no abdominal pain, no back pain, no cough, no fever, no headache, no nausea and not vomiting   Risk factors: coronary artery disease and hypertension     Past Medical History  Diagnosis Date  . Granular cell myoblastoma, malignant     a. myoblastoma  approx 1998, in remission. underwent gamma knife  procedure  . Tobacco abuse     16 pack history   . Chronic systolic CHF (congestive heart failure)     a. Echocardiogram  (09/01/13): Mild LVH, EF 30-35%, diffuse HK, mild AI mild MR, mild to moderate LAE, mild RAE, PASP 45  . Coronary artery disease     a. NSTEMI 08/2013 in setting of AFlutter with RVR (demand ischemia);  LHC (09/03/13): Distal left main 30, proximal LAD 40, mid LAD 40-50, mid AV groove circumflex 40, proximal OM1 30, ostial OM3 50, EF 20-25%.  Marland Kitchen NICM (nonischemic cardiomyopathy)   . Atrial flutter with rapid ventricular response   . Atrial fibrillation   . Seizures 2005-2006    "thought from myoblastoma" (09/21/2013)  . Pneumonia ~ 1964  . Exertional shortness of breath   . H/O hiatal hernia   . GERD (gastroesophageal reflux disease)   . YTWKMQKM(638.1)     "weekly" (09/21/2013)  . Anxiety    Past Surgical History  Procedure Laterality Date  . Anterior cruciate  ligament repair Left 1977  . Hiatal hernia repair  1990's  . Gamma knife surgery      myoblastoma ~1998  . Ablation  09/22/2013    CTI ablation by Dr Ladona Ridgel   Family History  Problem Relation Age of Onset  . Heart attack Mother     alive  . Heart disease Mother   . Hypertension Mother    History  Substance Use Topics  . Smoking status: Current Every Day Smoker -- 0.12 packs/day for 16 years    Types: Cigarettes  . Smokeless tobacco: Never Used  . Alcohol Use: Yes     Comment: 09/21/2013 "probably 2 drinks/month"    Review of Systems  Constitutional: Negative for fever and chills.  HENT: Negative for sore throat.   Eyes: Negative for pain.  Respiratory: Positive for shortness of breath. Negative for cough.   Cardiovascular: Positive for chest pain.  Gastrointestinal: Negative for nausea, vomiting and abdominal pain.  Genitourinary: Negative for dysuria and flank pain.  Musculoskeletal: Negative for back pain and neck pain.  Skin: Negative for rash.  Neurological: Negative for seizures and headaches.    Allergies  Acetaminophen and Peanut butter flavor  Home Medications   Current Outpatient Rx  Name  Route  Sig  Dispense  Refill  . carvedilol (COREG) 12.5 MG tablet   Oral   Take 12.5 mg by mouth 2 (two) times daily with a  meal.         . Cholecalciferol (VITAMIN D) 2000 UNITS CAPS   Oral   Take 2,000 Units by mouth daily.         . DiphenhydrAMINE HCl (ALKA-SELTZER PLUS ALLERGY PO)   Oral   Take 2 tablets by mouth every 6 (six) hours as needed (for cough/cold).         . furosemide (LASIX) 40 MG tablet   Oral   Take 40 mg by mouth daily.         Marland Kitchen lisinopril (PRINIVIL,ZESTRIL) 5 MG tablet   Oral   Take 5 mg by mouth daily.         . potassium chloride SA (K-DUR,KLOR-CON) 20 MEQ tablet   Oral   Take 20 mEq by mouth daily.         Marland Kitchen POTASSIUM PO   Oral   Take 4 tablets by mouth 2 (two) times daily.         . Rivaroxaban (XARELTO) 20 MG  TABS tablet   Oral   Take 20 mg by mouth daily with supper.          BP 144/93  Pulse 65  Temp(Src) 99.9 F (37.7 C) (Oral)  Resp 16  SpO2 100% Physical Exam  Constitutional: He is oriented to person, place, and time. He appears well-developed and well-nourished. No distress.  HENT:  Head: Normocephalic and atraumatic.  Eyes: Pupils are equal, round, and reactive to light.  Neck: Normal range of motion.  Cardiovascular: Normal rate and regular rhythm.   Pulmonary/Chest: Effort normal and breath sounds normal.  Abdominal: Soft. He exhibits no distension. There is no tenderness.  Musculoskeletal: Normal range of motion.  Neurological: He is alert and oriented to person, place, and time.  Skin: Skin is warm. He is not diaphoretic.  Psychiatric: His mood appears anxious.    ED Course  Procedures (including critical care time) Labs Review Labs Reviewed  CBC - Abnormal; Notable for the following:    RBC 3.88 (*)    HCT 37.8 (*)    MCH 34.3 (*)    All other components within normal limits  BASIC METABOLIC PANEL - Abnormal; Notable for the following:    Potassium 3.5 (*)    GFR calc non Af Amer 68 (*)    GFR calc Af Amer 78 (*)    All other components within normal limits  PRO B NATRIURETIC PEPTIDE - Abnormal; Notable for the following:    Pro B Natriuretic peptide (BNP) 1376.0 (*)    All other components within normal limits  POCT I-STAT TROPONIN I  POCT I-STAT TROPONIN I   Imaging Review Dg Chest Port 1 View  10/11/2013   CLINICAL DATA:  Chest pain  EXAM: PORTABLE CHEST - 1 VIEW  COMPARISON:  09/01/2013  FINDINGS: Cardiomediastinal silhouette is stable. No acute infiltrate or pleural effusion. No pulmonary edema. Stable old fracture of the right 5th rib .  IMPRESSION: No active disease.   Electronically Signed   By: Lahoma Crocker M.D.   On: 10/11/2013 18:55    EKG Interpretation    Date/Time:  Sunday October 11 2013 18:10:08 EST Ventricular Rate:  68 PR  Interval:  112 QRS Duration: 122 QT Interval:  426 QTC Calculation: 452 R Axis:   -12 Text Interpretation:  Normal sinus rhythm ST \\T \ T wave abnormality, consider lateral ischemia `lateral st/t changes noted on prior ecg Confirmed by Ashok Cordia  MD, KEVIN ([id number]) on 10/11/2013 6:31:46 PM  MDM   1. Chest pain    53 yo M with hx of recent NSTEMI (demand ischemia), afib with RVR, now s/p ablation, NICM, who presents with L sided chest pain/pressure.   Given normal PE, likely c/w anxiety. Patient has been checking his blood pressures at home, and has been worried because they have been elevated, and after checking and worrying began having chest pain. Will check initial labs, and consult cardiology. Patient with life vest, [facility] records reviewed with no concerning changes. Notable only for artifacts.   EKG unchanged. Recent cath reviewed. Normal troponin levels x2. Discussed with patient that NSTEMI/UA unlikely. Patient with improvement in symptoms without medical intervention. Discussed plan and likely diagnosis with physician on-call for patient's cardiologist, who is in agreement with plan. Patient to follow-up with cardiologist urgently. Discussed with patient, who is comfortable with aforementioned plan, and will follow-up as directed. Strict return precautions given. Patient seen and evaluated by myself and my attending, Dr. Ashok Cordia.      Freddi Che, MD 10/12/13 0120

## 2013-10-11 NOTE — Discharge Instructions (Signed)
Chest Pain (Nonspecific) °It is often hard to give a specific diagnosis for the cause of chest pain. There is always a chance that your pain could be related to something serious, such as a heart attack or a blood clot in the lungs. You need to follow up with your caregiver for further evaluation. °CAUSES  °· Heartburn. °· Pneumonia or bronchitis. °· Anxiety or stress. °· Inflammation around your heart (pericarditis) or lung (pleuritis or pleurisy). °· A blood clot in the lung. °· A collapsed lung (pneumothorax). It can develop suddenly on its own (spontaneous pneumothorax) or from injury (trauma) to the chest. °· Shingles infection (herpes zoster virus). °The chest wall is composed of bones, muscles, and cartilage. Any of these can be the source of the pain. °· The bones can be bruised by injury. °· The muscles or cartilage can be strained by coughing or overwork. °· The cartilage can be affected by inflammation and become sore (costochondritis). °DIAGNOSIS  °Lab tests or other studies, such as X-rays, electrocardiography, stress testing, or cardiac imaging, may be needed to find the cause of your pain.  °TREATMENT  °· Treatment depends on what may be causing your chest pain. Treatment may include: °· Acid blockers for heartburn. °· Anti-inflammatory medicine. °· Pain medicine for inflammatory conditions. °· Antibiotics if an infection is present. °· You may be advised to change lifestyle habits. This includes stopping smoking and avoiding alcohol, caffeine, and chocolate. °· You may be advised to keep your head raised (elevated) when sleeping. This reduces the chance of acid going backward from your stomach into your esophagus. °· Most of the time, nonspecific chest pain will improve within 2 to 3 days with rest and mild pain medicine. °HOME CARE INSTRUCTIONS  °· If antibiotics were prescribed, take your antibiotics as directed. Finish them even if you start to feel better. °· For the next few days, avoid physical  activities that bring on chest pain. Continue physical activities as directed. °· Do not smoke. °· Avoid drinking alcohol. °· Only take over-the-counter or prescription medicine for pain, discomfort, or fever as directed by your caregiver. °· Follow your caregiver's suggestions for further testing if your chest pain does not go away. °· Keep any follow-up appointments you made. If you do not go to an appointment, you could develop lasting (chronic) problems with pain. If there is any problem keeping an appointment, you must call to reschedule. °SEEK MEDICAL CARE IF:  °· You think you are having problems from the medicine you are taking. Read your medicine instructions carefully. °· Your chest pain does not go away, even after treatment. °· You develop a rash with blisters on your chest. °SEEK IMMEDIATE MEDICAL CARE IF:  °· You have increased chest pain or pain that spreads to your arm, neck, jaw, back, or abdomen. °· You develop shortness of breath, an increasing cough, or you are coughing up blood. °· You have severe back or abdominal pain, feel nauseous, or vomit. °· You develop severe weakness, fainting, or chills. °· You have a fever. °THIS IS AN EMERGENCY. Do not wait to see if the pain will go away. Get medical help at once. Call your local emergency services (911 in [place] Do not drive yourself to the hospital. °MAKE SURE YOU:  °· Understand these instructions. °· Will watch your condition. °· Will get help right away if you are not doing well or get worse. °Document Released: 06/27/2005 Document Revised: 12/10/2011 Document Reviewed: 04/22/2008 °Exit[employer]® Patient Information ©2014 [employer]   [employer]. ° °

## 2013-10-11 NOTE — ED Notes (Signed)
Resident at bedside explaining discharge

## 2013-10-12 ENCOUNTER — Telehealth: Payer: Self-pay | Admitting: *Deleted

## 2013-10-12 ENCOUNTER — Telehealth: Payer: Self-pay | Admitting: Internal Medicine

## 2013-10-12 MED ORDER — LISINOPRIL 10 MG PO TABS: 10.0000 mg | ORAL_TABLET | Freq: Every day | ORAL | Status: DC

## 2013-10-12 NOTE — Telephone Encounter (Signed)
Patient called requesting to speak to a nurse since no one had followed up with him after he called earlier today. C/O burning in his chest, his blood pressure has been very high and that his life vest keeps going off. He went to the ed yesterday as well.  He wants to know if he should be seen sooner. Please advise. Thanks, MI

## 2013-10-12 NOTE — Telephone Encounter (Signed)
Spoke with patient and just seems somewhat apprehensive in regards to his BP  Says it has been elevated for the last several days.  150/100's---i have talked with patient and his Lifevesta has alamed several times but I called into [facility] and this is all artifact.  I have asked him to use some lotion in the round disk to make a better connection.  This should help.  In regards to his BP, I talked with Dr Caryl Comes and we are going to increase his Lisinopril to 10mg  daily and he will keep his follow up with Beckey Downing on 10/20/13.  Patient verbalized understanding and agrees with plan

## 2013-10-12 NOTE — Telephone Encounter (Signed)
See previous telephone note. 

## 2013-10-12 NOTE — Telephone Encounter (Signed)
New message     Pt says dr Caryl Comes is his doctor and he has a life vest.  Pt was seen in er for bp problems and wanted to know if we need to see him sooner than his scheduled appt.

## 2013-10-13 NOTE — ED Provider Notes (Signed)
I saw and evaluated the patient, reviewed the resident's note and I agree with the findings and plan.  EKG Interpretation    Date/Time:  Sunday October 11 2013 18:10:08 EST Ventricular Rate:  68 PR Interval:  112 QRS Duration: 122 QT Interval:  426 QTC Calculation: 452 R Axis:   -12 Text Interpretation:  Normal sinus rhythm ST \\T \ T wave abnormality, consider lateral ischemia `lateral st/t changes noted on prior ecg Confirmed by Ashok Cordia  MD, Uziah Sorter ([id number]) on 10/11/2013 6:31:46 PM            Above ecg interpreted by me.  Pt c/o constant, sharp cp. Worse w movement and palpation. Hx non ischemic cm.  Labs. Cardiology consult.   Mirna Mires, MD 10/13/13 1125

## 2013-10-20 ENCOUNTER — Ambulatory Visit (INDEPENDENT_AMBULATORY_CARE_PROVIDER_SITE_OTHER): Payer: Medicaid Other | Admitting: Cardiology

## 2013-10-20 ENCOUNTER — Encounter: Payer: Self-pay | Admitting: Cardiology

## 2013-10-20 VITALS — BP 130/80 | HR 68 | Ht 71.0 in | Wt 197.0 lb

## 2013-10-20 DIAGNOSIS — Z9889 Other specified postprocedural states: Secondary | ICD-10-CM

## 2013-10-20 DIAGNOSIS — I5022 Chronic systolic (congestive) heart failure: Secondary | ICD-10-CM

## 2013-10-20 DIAGNOSIS — I428 Other cardiomyopathies: Secondary | ICD-10-CM

## 2013-10-20 DIAGNOSIS — M7020 Olecranon bursitis, unspecified elbow: Secondary | ICD-10-CM

## 2013-10-20 DIAGNOSIS — I4892 Unspecified atrial flutter: Secondary | ICD-10-CM

## 2013-10-20 DIAGNOSIS — M7021 Olecranon bursitis, right elbow: Secondary | ICD-10-CM

## 2013-10-20 DIAGNOSIS — Z8679 Personal history of other diseases of the circulatory system: Secondary | ICD-10-CM

## 2013-10-20 NOTE — Patient Instructions (Addendum)
Your physician recommends that you schedule a follow-up appointment in  with Dr. Caryl Comes after echo  Your physician has requested that you have an echocardiogram. In 5 weeks Echocardiography is a painless test that uses sound waves to create images of your heart. It provides your doctor with information about the size and shape of your heart and how well your heart's chambers and valves are working. This procedure takes approximately one hour. There are no restrictions for this procedure.      Your physician has recommended you make the following change in your medication:   1. Stop Xarelto    Your Physician recommends for you to keep a record of your pulse daily. If your pulse is above 110 give [place] a call

## 2013-10-20 NOTE — Progress Notes (Signed)
Patient ID: Justin Huerta MRN: [id number], DOB/AGE: [birth year]   Date of Visit: 10/20/2013  Primary Physician: [name], MD Primary Cardiologist: Ena Dawley, MD Primary EP: Jolyn Nap, MD Reason for Visit: Hospital follow-up  History of Present Illness  Justin Huerta is a 53 y.o. male with newly diagnosed atrial flutter, NICM, EF 20-25%, nonob CAD and myoblastoma in remission who underwent atrial flutter ablation 09/22/2013 and presents today for follow-up.   While in atrial flutter, he experienced fatigue, chest "burning" pain, intermittent SOB and increased LE swelling. He did not have palpitations or syncope. He tolerated the ablation procedure well without complication. He states his groin site has healed.   Since his ablation procedure, he reports he is doing well. He felt brief "burning" discomfort once in the lower neck area a few days after discharge but denies recurrence. The only other concern he mentions is swelling just over the right elbow joint. He installed insulation in the ceiling at his mother's house yesterday and this morning with frequent lifting maneuvers above his head. He noticed the swelling just prior to his appointment today. It is not warm, tender or red. He denies chest pain or shortness of breath. He denies palpitations, dizziness, near syncope or syncope. He denies LE swelling, orthopnea, PND or recent weight gain. He is compliant and tolerating medications without difficulty. He is wearing his LifeVest.   Past Medical History Past Medical History  Diagnosis Date  . Granular cell myoblastoma, malignant     a. myoblastoma  approx 1998, in remission. underwent gamma knife  procedure  . Tobacco abuse     16 pack history   . Chronic systolic CHF (congestive heart failure)     a. Echocardiogram  (09/01/13): Mild LVH, EF 30-35%, diffuse HK, mild AI mild MR, mild to moderate LAE, mild RAE, PASP 45  . Coronary artery disease     a. NSTEMI 08/2013 in  setting of AFlutter with RVR (demand ischemia);  LHC (09/03/13): Distal left main 30, proximal LAD 40, mid LAD 40-50, mid AV groove circumflex 40, proximal OM1 30, ostial OM3 50, EF 20-25%.  Marland Kitchen NICM (nonischemic cardiomyopathy)   . Atrial flutter with rapid ventricular response   . Atrial fibrillation   . Seizures 2005-2006    "thought from myoblastoma" (09/21/2013)  . Pneumonia ~ 1964  . Exertional shortness of breath   . H/O hiatal hernia   . GERD (gastroesophageal reflux disease)   . HALPFXTK(240.9)     "weekly" (09/21/2013)  . Anxiety     Past Surgical History Past Surgical History  Procedure Laterality Date  . Anterior cruciate ligament repair Left 1977  . Hiatal hernia repair  1990's  . Gamma knife surgery      myoblastoma ~1998  . Ablation  09/22/2013    CTI ablation by Dr Lovena Le    Allergies/Intolerances Allergies  Allergen Reactions  . Acetaminophen     "shaky" "makes me angry"  . Peanut Butter Flavor     Breathing problems, swells lips    Current Home Medications Current Outpatient Prescriptions  Medication Sig Dispense Refill  . carvedilol (COREG) 12.5 MG tablet Take 12.5 mg by mouth 2 (two) times daily with a meal.      . Cholecalciferol (VITAMIN D) 2000 UNITS CAPS Take 2,000 Units by mouth daily.      . furosemide (LASIX) 40 MG tablet Take 40 mg by mouth daily.      Marland Kitchen lisinopril (PRINIVIL,ZESTRIL) 10 MG tablet Take 1 tablet (10 mg  total) by mouth daily.  90 tablet  3  . potassium chloride SA (K-DUR,KLOR-CON) 20 MEQ tablet Take 20 mEq by mouth daily.      . Rivaroxaban (XARELTO) 20 MG TABS tablet Take 20 mg by mouth daily with supper.       No current facility-administered medications for this visit.    Social History History   Social History  . Marital Status: Single    Spouse Name: N/A    Number of Children: 2  . Years of Education: N/A   Occupational History  . Sales     Self employed    Social History Main Topics  . Smoking status: Current  Every Day Smoker -- 0.12 packs/day for 16 years    Types: Cigarettes  . Smokeless tobacco: Never Used  . Alcohol Use: Yes     Comment: 09/21/2013 "probably 2 drinks/month"  . Drug Use: Yes    Special: Marijuana     Comment: 09/21/2013 "used to smoke marijuana twice/wk; since 08/31/2013 I don't smoke it at all"  . Sexual Activity: Yes   Other Topics Concern  . 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 He has 2 sons and lives with wife.      Review of Systems General: No chills, fever, night sweats or weight changes Cardiovascular: No chest pain, dyspnea on exertion, edema, orthopnea, palpitations, paroxysmal nocturnal dyspnea Dermatological: No rash, lesions or masses Respiratory: No cough, dyspnea Urologic: No hematuria, dysuria Abdominal: No nausea, vomiting, diarrhea, bright red blood per rectum, melena, or hematemesis Neurologic: No visual changes, weakness, changes in mental status All other systems reviewed and are otherwise negative except as noted above.  Physical Exam Vitals: Blood pressure 130/80, pulse 68, height 5\' 11"  (1.803 m), weight 197 lb (89.359 kg).  General: Well developed, well appearing 53 y.o. male in no acute distress. HEENT: Normocephalic, atraumatic. EOMs intact. Sclera nonicteric. Oropharynx clear.  Neck: Supple without bruits. No JVD. Lungs: Respirations regular and unlabored, CTA bilaterally. No wheezes, rales or rhonchi. Heart: RRR. S1, S2 present. No murmurs, rub, S3 or S4. Abdomen: Soft, non-tender, non-distended. BS present x 4 quadrants. No hepatosplenomegaly.  Extremities: No clubbing, cyanosis or edema. DP/PT/Radials 2+ and equal bilaterally. Minimal fluctuant edema over right olecranon process. Not erythematous, ecchymotic or tender to palpation.  Psych: Normal affect. Neuro: Alert and oriented X 3. Moves all extremities spontaneously.   Diagnostics 12-lead ECG today - NSR  at 62 bpm; LVH with QRS widening; inferolateral T wave inversions; PR 120, QRS 122, QT/QTc 444/450; unchanged from previous ECGs in SR  Assessment and Plan  1. Atrial flutter s/p RF ablation - maintaining SR - no symptomatic or documented recurrence of atrial flutter - no history of or documented atrial fibrillation - stop Xarelto  2. NICM, EF 20-25%, with chronic systolic HF - stable without HF symptoms - continue medical therapy - continue wearing LifeVest - repeat echo in 8-10 weeks to reassess LV function  3. Nonobstructive CAD - cath Dec 2014 - distal left main 30%, proximal LAD 40%, mid LAD 40-50%, mid AV groove circumflex 40%, proximal OM-1 30%, ostial OM-3 50%  4. Probable olecranon bursitis - due to overuse from installing insulation overhead in ceiling for the past 2 days - not erythematous, ecchymotic or tender to palpation  - advised rest and follow-up with PCP for management  Signed, Kaysan Peixoto, PA-C 10/20/2013, 5:11 PM

## 2013-10-26 ENCOUNTER — Telehealth: Payer: Self-pay

## 2013-10-26 ENCOUNTER — Other Ambulatory Visit: Payer: Self-pay

## 2013-10-26 MED ORDER — CARVEDILOL 12.5 MG PO TABS: 12.5000 mg | ORAL_TABLET | Freq: Two times a day (BID) | ORAL | Status: DC

## 2013-10-26 NOTE — Telephone Encounter (Signed)
Advised pt to follow [facility] with [facility], that Dr Caryl Comes does not prescribe this medication. Pt agreeable to plan.

## 2013-10-27 ENCOUNTER — Telehealth: Payer: Self-pay | Admitting: Internal Medicine

## 2013-10-27 NOTE — Telephone Encounter (Signed)
Pt called regarding a medication Xanax, pt states that he is having a lot of anxiety, please contact pt

## 2013-10-27 NOTE — Telephone Encounter (Signed)
Spoke with pt and informed medication Xanax not ordered by provider. Pt will need to schedule office visit

## 2013-10-28 ENCOUNTER — Other Ambulatory Visit: Payer: Self-pay | Admitting: *Deleted

## 2013-10-28 MED ORDER — FUROSEMIDE 40 MG PO TABS: 40.0000 mg | ORAL_TABLET | Freq: Every day | ORAL | Status: DC

## 2013-11-11 ENCOUNTER — Ambulatory Visit: Payer: Medicaid Other | Attending: Internal Medicine | Admitting: Internal Medicine

## 2013-11-11 ENCOUNTER — Encounter: Payer: Self-pay | Admitting: Internal Medicine

## 2013-11-11 VITALS — BP 159/99 | HR 59 | Temp 99.5°F | Resp 16 | Ht 71.0 in | Wt 197.0 lb

## 2013-11-11 DIAGNOSIS — E559 Vitamin D deficiency, unspecified: Secondary | ICD-10-CM

## 2013-11-11 DIAGNOSIS — I10 Essential (primary) hypertension: Secondary | ICD-10-CM | POA: Diagnosis not present

## 2013-11-11 DIAGNOSIS — F17200 Nicotine dependence, unspecified, uncomplicated: Secondary | ICD-10-CM | POA: Insufficient documentation

## 2013-11-11 DIAGNOSIS — M71329 Other bursal cyst, unspecified elbow: Secondary | ICD-10-CM

## 2013-11-11 DIAGNOSIS — Z720 Tobacco use: Secondary | ICD-10-CM

## 2013-11-11 DIAGNOSIS — M6749 Ganglion, multiple sites: Secondary | ICD-10-CM

## 2013-11-11 DIAGNOSIS — I2510 Atherosclerotic heart disease of native coronary artery without angina pectoris: Secondary | ICD-10-CM | POA: Insufficient documentation

## 2013-11-11 MED ORDER — LISINOPRIL 20 MG PO TABS: 20.0000 mg | ORAL_TABLET | Freq: Every day | ORAL | Status: DC

## 2013-11-11 NOTE — Progress Notes (Signed)
Since 10/24/13 pt has had some swelling around his right elbow and right hand

## 2013-11-11 NOTE — Progress Notes (Signed)
MRN: [id number] Name: Justin Huerta  Sex: male Age: 53 y.o. DOB: Feb 02, 1961  Allergies: Acetaminophen and Peanut butter flavor  Chief Complaint  Patient presents with  . Follow-up    HPI: Patient is 53 y.o. male who has history of hypertension CAD, tobacco abuse history comes today for followup today's blood pressure is elevated denies any headache dizziness chest pain or shortness of breath, patient follows up with the cardiologist, patient is still smoking he is going to try to quit already prescribed nicotine patch, he reported to have noticed a cyst in his right elbow joint which is not painful but it has been persistent for the last 4-6 weeks denies any fever chills any redness.  Past Medical History  Diagnosis Date  . Granular cell myoblastoma, malignant     a. myoblastoma  approx 1998, in remission. underwent gamma knife  procedure  . Tobacco abuse     16 pack history   . Chronic systolic CHF (congestive heart failure)     a. Echocardiogram  (09/01/13): Mild LVH, EF 30-35%, diffuse HK, mild AI mild MR, mild to moderate LAE, mild RAE, PASP 45  . Coronary artery disease     a. NSTEMI 08/2013 in setting of AFlutter with RVR (demand ischemia);  LHC (09/03/13): Distal left main 30, proximal LAD 40, mid LAD 40-50, mid AV groove circumflex 40, proximal OM1 30, ostial OM3 50, EF 20-25%.  Marland Kitchen NICM (nonischemic cardiomyopathy)   . Atrial flutter with rapid ventricular response   . Atrial fibrillation   . Seizures 2005-2006    "thought from myoblastoma" (09/21/2013)  . Pneumonia ~ 1964  . Exertional shortness of breath   . H/O hiatal hernia   . GERD (gastroesophageal reflux disease)   . DPOEUMPN(361.4)     "weekly" (09/21/2013)  . Anxiety     Past Surgical History  Procedure Laterality Date  . Anterior cruciate ligament repair Left 1977  . Hiatal hernia repair  1990's  . Gamma knife surgery      myoblastoma ~1998  . Ablation  09/22/2013    CTI ablation by Dr Lovena Le        Medication List       This list is accurate as of: 11/11/13 10:45 AM.  Always use your most recent med list.               carvedilol 12.5 MG tablet  Commonly known as:  COREG  Take 1 tablet (12.5 mg total) by mouth 2 (two) times daily with a meal.     furosemide 40 MG tablet  Commonly known as:  LASIX  Take 1 tablet (40 mg total) by mouth daily.     lisinopril 20 MG tablet  Commonly known as:  PRINIVIL,ZESTRIL  Take 1 tablet (20 mg total) by mouth daily.     potassium chloride SA 20 MEQ tablet  Commonly known as:  K-DUR,KLOR-CON  Take 20 mEq by mouth daily.     Vitamin D 2000 UNITS Caps  Take 2,000 Units by mouth daily.     XARELTO 20 MG Tabs tablet  Generic drug:  Rivaroxaban  Take 20 mg by mouth daily with supper.        Meds ordered this encounter  Medications  . lisinopril (PRINIVIL,ZESTRIL) 20 MG tablet    Sig: Take 1 tablet (20 mg total) by mouth daily.    Dispense:  90 tablet    Refill:  3    Immunization History  Administered Date(s) Administered  .  Influenza,inj,Quad PF,36+ Mos 09/30/2013    Family History  Problem Relation Age of Onset  . Heart attack Mother     alive  . Heart disease Mother   . Hypertension Mother     History  Substance Use Topics  . Smoking status: Current Every Day Smoker -- 0.12 packs/day for 16 years    Types: Cigarettes  . Smokeless tobacco: Never Used  . Alcohol Use: Yes     Comment: 09/21/2013 "probably 2 drinks/month"    Review of Systems   As noted in HPI  Filed Vitals:   11/11/13 1013  BP: 159/99  Pulse: 59  Temp: 99.5 F (37.5 C)  Resp: 16    Physical Exam  Physical Exam  Constitutional: No distress.  Eyes: EOM are normal. Pupils are equal, round, and reactive to light.  Cardiovascular: Normal rate and regular rhythm.   Pulmonary/Chest: No respiratory distress. He has no wheezes. He has no rales.  Musculoskeletal:  Right elbow extensor surface palpable olecranon bursa cyst, nontender, no  erythema or warmth    CBC    Component Value Date/Time   WBC 4.8 10/11/2013 1830   RBC 3.88* 10/11/2013 1830   HGB 13.3 10/11/2013 1830   HCT 37.8* 10/11/2013 1830   PLT 268 10/11/2013 1830   MCV 97.4 10/11/2013 1830   LYMPHSABS 1.5 09/21/2013 1432   MONOABS 0.3 09/21/2013 1432   EOSABS 0.1 09/21/2013 1432   BASOSABS 0.0 09/21/2013 1432    CMP     Component Value Date/Time   NA 138 10/11/2013 1830   K 3.5* 10/11/2013 1830   CL 98 10/11/2013 1830   CO2 26 10/11/2013 1830   GLUCOSE 82 10/11/2013 1830   BUN 16 10/11/2013 1830   CREATININE 1.19 10/11/2013 1830   CREATININE 1.27 09/30/2013 1043   CALCIUM 9.1 10/11/2013 1830   PROT 7.4 09/30/2013 1043   ALBUMIN 3.7 09/30/2013 1043   AST 21 09/30/2013 1043   ALT 16 09/30/2013 1043   ALKPHOS 53 09/30/2013 1043   BILITOT 0.4 09/30/2013 1043   GFRNONAA 68* 10/11/2013 1830   GFRAA 78* 10/11/2013 1830    Lab Results  Component Value Date/Time   CHOL 163 09/02/2013  9:40 AM    No components found with this basename: hga1c    Lab Results  Component Value Date/Time   AST 21 09/30/2013 10:43 AM    Assessment and Plan  HTN (hypertension) uncontrolled- Plan: I have increased the dose of lisinopril (PRINIVIL,ZESTRIL) 20 MG tablet daily, continue with Lasix and Coreg.  Tobacco abuse Patient is going to try to quit smoking  CAD (coronary artery disease) Continue to follow with cardiology.  Bursal cyst of olecranon - Plan: Ambulatory referral to Orthopedic Surgery for further evaluation and management.  Unspecified vitamin D deficiency Patient has started taking vitamin D supplement.   Return in about 3 months (around 02/08/2014).  [name], MD

## 2013-11-17 ENCOUNTER — Ambulatory Visit: Payer: Self-pay | Admitting: Family Medicine

## 2013-11-18 ENCOUNTER — Ambulatory Visit: Payer: Self-pay | Admitting: Family Medicine

## 2013-11-19 ENCOUNTER — Encounter: Payer: Self-pay | Admitting: Family Medicine

## 2013-11-19 ENCOUNTER — Ambulatory Visit (INDEPENDENT_AMBULATORY_CARE_PROVIDER_SITE_OTHER): Payer: Medicaid Other | Admitting: Family Medicine

## 2013-11-19 ENCOUNTER — Ambulatory Visit: Payer: Self-pay | Admitting: Family Medicine

## 2013-11-19 VITALS — BP 147/93 | HR 65 | Ht 71.0 in | Wt 195.0 lb

## 2013-11-19 DIAGNOSIS — M7020 Olecranon bursitis, unspecified elbow: Secondary | ICD-10-CM

## 2013-11-19 DIAGNOSIS — M7021 Olecranon bursitis, right elbow: Secondary | ICD-10-CM | POA: Insufficient documentation

## 2013-11-19 DIAGNOSIS — G5601 Carpal tunnel syndrome, right upper limb: Secondary | ICD-10-CM

## 2013-11-19 DIAGNOSIS — G5600 Carpal tunnel syndrome, unspecified upper limb: Secondary | ICD-10-CM

## 2013-11-19 NOTE — Assessment & Plan Note (Signed)
discussed his options and he would like to have this drained and injected which was done today.  Compression, icing, consider elbow pad.  After informed written consent patient was seated on exam table.  Area overlying right olecranon bursa prepped with alcohol swab, 40mL marcaine used for local anesthesia.  Then 52mL serosanguinous fluid aspirated from right olecranon bursa followed by injection of 53mL depomedrol.  Patient tolerated procedure well without immediate complications.

## 2013-11-19 NOTE — Assessment & Plan Note (Signed)
related to cyst in this area crowding median nerve.  Start with typical carpal tunnel treatment - wrist brace regularly especially at nighttime.  F/u in 6 weeks for reevaluation.  Consider injection, aspiration/injection of cyst if not improving.

## 2013-11-19 NOTE — Patient Instructions (Signed)
You have olecranon bursitis. Ice the area 3-4 times a day for 15 minutes at a time for 5-7 days. ACE wrap as often as possible for next 2 weeks to keep swelling down. Consider aspiration and injection of the area as well - we did this today. Consider elbow pad for protection to prevent irritation and additional swelling.  You have carpal tunnel syndrome related to a cyst in this area of your wrist. Wear the wrist splint at nighttime and as often as possible during the day If not improving would consider either injection of carpal tunnel or aspiration/injection of the cyst in this area. Follow up with me in 6 weeks for reevaluation (or as needed if you're feeling better).

## 2013-11-19 NOTE — Progress Notes (Signed)
Patient ID: Justin Huerta, male   DOB: 1961/09/25, 53 y.o.   MRN: [id number]  PCP: [name], MD  Subjective:   HPI: Patient is a 53 y.o. male here for right elbow, hand pain.  Patient reports started getting right elbow swelling about 1 month ago without injury. Soreness but no real pain. Does bother him if he rests elbow on something. No redness, fevers. No prior issues with elbow.  Around same time developed numbness into thumb through middle fingers (mostly index and middle) with swelling volar wrist and pain here. Worse first thing in the morning. No neck pain.  Past Medical History  Diagnosis Date  . Granular cell myoblastoma, malignant     a. myoblastoma  approx 1998, in remission. underwent gamma knife  procedure  . Tobacco abuse     16 pack history   . Chronic systolic CHF (congestive heart failure)     a. Echocardiogram  (09/01/13): Mild LVH, EF 30-35%, diffuse HK, mild AI mild MR, mild to moderate LAE, mild RAE, PASP 45  . Coronary artery disease     a. NSTEMI 08/2013 in setting of AFlutter with RVR (demand ischemia);  LHC (09/03/13): Distal left main 30, proximal LAD 40, mid LAD 40-50, mid AV groove circumflex 40, proximal OM1 30, ostial OM3 50, EF 20-25%.  Marland Kitchen NICM (nonischemic cardiomyopathy)   . Atrial flutter with rapid ventricular response   . Atrial fibrillation   . Seizures 2005-2006    "thought from myoblastoma" (09/21/2013)  . Pneumonia ~ 1964  . Exertional shortness of breath   . H/O hiatal hernia   . GERD (gastroesophageal reflux disease)   . NWGNFAOZ(308.6)     "weekly" (09/21/2013)  . Anxiety     Current Outpatient Prescriptions on File Prior to Visit  Medication Sig Dispense Refill  . carvedilol (COREG) 12.5 MG tablet Take 1 tablet (12.5 mg total) by mouth 2 (two) times daily with a meal.  60 tablet  3  . Cholecalciferol (VITAMIN D) 2000 UNITS CAPS Take 2,000 Units by mouth daily.      . furosemide (LASIX) 40 MG tablet Take 1 tablet (40 mg  total) by mouth daily.  30 tablet  3  . lisinopril (PRINIVIL,ZESTRIL) 20 MG tablet Take 1 tablet (20 mg total) by mouth daily.  90 tablet  3  . potassium chloride SA (K-DUR,KLOR-CON) 20 MEQ tablet Take 20 mEq by mouth daily.      . Rivaroxaban (XARELTO) 20 MG TABS tablet Take 20 mg by mouth daily with supper.       No current facility-administered medications on file prior to visit.    Past Surgical History  Procedure Laterality Date  . Anterior cruciate ligament repair Left 1977  . Hiatal hernia repair  1990's  . Gamma knife surgery      myoblastoma ~1998  . Ablation  09/22/2013    CTI ablation by Dr Lovena Le    Allergies  Allergen Reactions  . Acetaminophen     "shaky" "makes me angry"  . Peanut Butter Flavor     Breathing problems, swells lips    History   Social History  . Marital Status: Single    Spouse Name: N/A    Number of Children: 2  . Years of Education: N/A   Occupational History  . Sales     Self employed    Social History Main Topics  . Smoking status: Current Every Day Smoker -- 0.12 packs/day for 16 years    Types: Cigarettes  .  Smokeless tobacco: Never Used  . Alcohol Use: Yes     Comment: 09/21/2013 "probably 2 drinks/month"  . Drug Use: Yes    Special: Marijuana     Comment: 09/21/2013 "used to smoke marijuana twice/wk; since 08/31/2013 I don't smoke it at all"  . Sexual Activity: Yes   Other Topics Concern  . 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 He has 2 sons and lives with wife.     Family History  Problem Relation Age of Onset  . Heart attack Mother     alive  . Heart disease Mother   . Hypertension Mother     BP 147/93  Pulse 65  Ht 5\' 11"  (1.803 m)  Wt 195 lb (88.451 kg)  BMI 27.21 kg/m2  Review of Systems: See HPI above.    Objective:  Physical Exam:  Gen: NAD  Right elbow: Swelling of olecranon bursa.  No redness, bruising. No TTP elbow or  bursa. FROM elbow without pain. Collateral ligaments intact. Tinels negative radial and cubital tunnels. 5/5 strength flexion/extension without pain.  Right hand/wrist: Mild swelling volar wrist radial aspect.  No bruising, other deformity. Mild TTP volar wrist over carpal tunnel.  No 1st compartment, 1st CMC, other tenderness. FROM wrists and digits with 5/5 strength. Positive tinels at carpal tunnel.  MSK u/s:  Median nerve volume 0.09cm, wnl.  Small cyst noted radial aspect of volar wrist.  Assessment & Plan:  1. Right olecranon bursitis - discussed his options and he would like to have this drained and injected which was done today.  Compression, icing, consider elbow pad.  After informed written consent patient was seated on exam table.  Area overlying right olecranon bursa prepped with alcohol swab, 81mL marcaine used for local anesthesia.  Then 61mL serosanguinous fluid aspirated from right olecranon bursa followed by injection of 59mL depomedrol.  Patient tolerated procedure well without immediate complications.  2. Right carpal tunnel syndrome - related to cyst in this area crowding median nerve.  Start with typical carpal tunnel treatment - wrist brace regularly especially at nighttime.  F/u in 6 weeks for reevaluation.  Consider injection, aspiration/injection of cyst if not improving.

## 2013-11-23 ENCOUNTER — Ambulatory Visit (HOSPITAL_COMMUNITY): Payer: Medicaid Other | Attending: Cardiology | Admitting: Cardiology

## 2013-11-23 DIAGNOSIS — R0609 Other forms of dyspnea: Secondary | ICD-10-CM | POA: Insufficient documentation

## 2013-11-23 DIAGNOSIS — I4892 Unspecified atrial flutter: Secondary | ICD-10-CM | POA: Insufficient documentation

## 2013-11-23 DIAGNOSIS — I428 Other cardiomyopathies: Secondary | ICD-10-CM | POA: Insufficient documentation

## 2013-11-23 DIAGNOSIS — I2510 Atherosclerotic heart disease of native coronary artery without angina pectoris: Secondary | ICD-10-CM | POA: Diagnosis not present

## 2013-11-23 DIAGNOSIS — I5021 Acute systolic (congestive) heart failure: Secondary | ICD-10-CM

## 2013-11-23 DIAGNOSIS — I359 Nonrheumatic aortic valve disorder, unspecified: Secondary | ICD-10-CM | POA: Diagnosis not present

## 2013-11-23 DIAGNOSIS — R0989 Other specified symptoms and signs involving the circulatory and respiratory systems: Secondary | ICD-10-CM | POA: Insufficient documentation

## 2013-11-23 NOTE — Progress Notes (Signed)
Limited echo performed. 

## 2013-11-30 ENCOUNTER — Telehealth: Payer: Self-pay | Admitting: Internal Medicine

## 2013-11-30 NOTE — Telephone Encounter (Signed)
New problem    Justin Huerta with [employer] will be faxing re-newall of pt's LIFE VEST he fax week ago.  Please give him a call when you got them.

## 2013-12-01 ENCOUNTER — Encounter: Payer: Self-pay | Admitting: Internal Medicine

## 2013-12-01 ENCOUNTER — Ambulatory Visit (INDEPENDENT_AMBULATORY_CARE_PROVIDER_SITE_OTHER): Payer: Medicaid Other | Admitting: Internal Medicine

## 2013-12-01 VITALS — BP 133/80 | HR 79 | Ht 71.0 in | Wt 202.0 lb

## 2013-12-01 DIAGNOSIS — I5022 Chronic systolic (congestive) heart failure: Secondary | ICD-10-CM

## 2013-12-01 DIAGNOSIS — I4892 Unspecified atrial flutter: Secondary | ICD-10-CM

## 2013-12-01 MED ORDER — FUROSEMIDE 40 MG PO TABS: 20.0000 mg | ORAL_TABLET | Freq: Every day | ORAL | Status: DC

## 2013-12-01 MED ORDER — POTASSIUM CHLORIDE CRYS ER 20 MEQ PO TBCR: 10.0000 meq | EXTENDED_RELEASE_TABLET | Freq: Every day | ORAL | Status: AC

## 2013-12-01 NOTE — Telephone Encounter (Signed)
Spoke with Justin Huerta Renewal is not due until 3/4  Let him know the vest was discontinued today No renewal needed

## 2013-12-01 NOTE — Telephone Encounter (Signed)
Forwarding to Janan Halter, RN who handles life vest in our office

## 2013-12-01 NOTE — Progress Notes (Signed)
Patient Care Team: Justin Marek, MD as PCP - General (Internal Medicine)   HPI  Justin Huerta is a 53 y.o. male Seen is followup for atrial flutter for which he underwent cathetr ablation in 12/14    He had presented with a severe cardiomyopathy with an ejection fraction of about 20-30%. Catheterization demonstrated no obstructive disease.  He was seen in the emergency room in early January follow of his ablation borderline positive troponin for chest pain. CT was negative.  He was discharged initially on amiodarone Rivaroxaban a LifeVest. With recurrent atrial flutter he was ablated over Christmas.  Repeat echocardiogram 2/15 demonstrated significant interval improvement to 45-50% ejection fraction .Marland Kitchen Left atrial dimension was 43/2.05  However, he was saw the last significant problems with dyspnea on exertion. He awakened a week ago very short of breath and it took him about 30 minutes before he relaxed. He was somewhat diaphoretic.  Is short of breath on his way into the office today from the parking lot. Walking in the hallways. His sats stayed above 94 (a little bit low) heart rate was below 100.  Past Medical History  Diagnosis Date  . Granular cell myoblastoma, malignant     a. myoblastoma  approx 1998, in remission. underwent gamma knife  procedure  . Tobacco abuse     16 pack history   . Chronic systolic CHF (congestive heart failure)     a. Echocardiogram  (09/01/13): Mild LVH, EF 30-35%, diffuse HK, mild AI mild MR, mild to moderate LAE, mild RAE, PASP 45  . Coronary artery disease     a. NSTEMI 08/2013 in setting of AFlutter with RVR (demand ischemia);  LHC (09/03/13): Distal left main 30, proximal LAD 40, mid LAD 40-50, mid AV groove circumflex 40, proximal OM1 30, ostial OM3 50, EF 20-25%.  Marland Kitchen NICM (nonischemic cardiomyopathy)   . Atrial flutter with rapid ventricular response   . Atrial fibrillation   . Seizures 2005-2006    "thought from myoblastoma"  (09/21/2013)  . Pneumonia ~ 1964  . Exertional shortness of breath   . H/O hiatal hernia   . GERD (gastroesophageal reflux disease)   . QMVHQION(629.5)     "weekly" (09/21/2013)  . Anxiety     Past Surgical History  Procedure Laterality Date  . Anterior cruciate ligament repair Left 1977  . Hiatal hernia repair  1990's  . Gamma knife surgery      myoblastoma ~1998  . Ablation  09/22/2013    CTI ablation by Dr Lovena Le    Current Outpatient Prescriptions  Medication Sig Dispense Refill  . carvedilol (COREG) 12.5 MG tablet Take 1 tablet (12.5 mg total) by mouth 2 (two) times daily with a meal.  60 tablet  3  . Cholecalciferol (VITAMIN D) 2000 UNITS CAPS Take 2,000 Units by mouth daily.      . furosemide (LASIX) 40 MG tablet Take 1 tablet (40 mg total) by mouth daily.  30 tablet  3  . lisinopril (PRINIVIL,ZESTRIL) 20 MG tablet Take 1 tablet (20 mg total) by mouth daily.  90 tablet  3  . potassium chloride SA (K-DUR,KLOR-CON) 20 MEQ tablet Take 20 mEq by mouth daily.       No current facility-administered medications for this visit.    Allergies  Allergen Reactions  . Acetaminophen     "shaky" "makes me angry"  . Peanut Butter Flavor     Breathing problems, swells lips    Review of Systems negative except  from HPI and PMH  Physical Exam BP 133/80  Pulse 79  Ht 5\' 11"  (1.803 m)  Wt 202 lb (91.627 kg)  BMI 28.19 kg/m2 Well developed and well nourished in mild resp a distress HENT normal E scleral and icterus clear Neck Supple JVP flat; carotids brisk and full Clear to ausculation  Regular rate and rhythm, no murmurs gallops or rub Soft with active bowel sounds No clubbing cyanosis   Edema Alert and oriented, grossly normal motor and sensory function Skin Warm and Dry  ECG NSR 73 09/11/40 LVH repol  Assessment and  Plan  Atrial flutter status post ablation  Nonischemic cardiac myopathy with interval significant improvement he is now 45%   Dyspnea on  exertion  Hypertension  Will happen soon as LifeVest back. We will discontinue his carvedilol as it may be contributing to the wheezing that was noted following walking in the office.  We'll have him see be in a couple of weeks. If he is still short of breath, we will check a BNP and significant for cardiopulmonary stress testing   For now will continue on anticoagulation  I wonder whether his nocturnal event I not paroxysmal atrial fibrillation. In the event that these episodes of shortness of breath remain episodic I would also utilize an event recorder

## 2013-12-01 NOTE — Patient Instructions (Addendum)
Your physician has recommended you make the following change in your medication:  1) Decrease Carvedilol to 6.25 mg twice daily for 5 days - then stop Carvedilol 2) Decrease Furosemide to 20 mg daily 3) Decrease Potassium chloride to 10 mEq daily  Stop wearing life vest  Your physician recommends that you schedule a follow-up appointment in: 3-4 weeks with Ileene Hutchinson, PA-C

## 2013-12-29 ENCOUNTER — Ambulatory Visit (INDEPENDENT_AMBULATORY_CARE_PROVIDER_SITE_OTHER): Payer: Medicaid Other | Admitting: Cardiology

## 2013-12-29 ENCOUNTER — Encounter: Payer: Self-pay | Admitting: Cardiology

## 2013-12-29 VITALS — BP 140/86 | HR 66 | Ht 72.0 in | Wt 201.0 lb

## 2013-12-29 DIAGNOSIS — I428 Other cardiomyopathies: Secondary | ICD-10-CM

## 2013-12-29 DIAGNOSIS — R0609 Other forms of dyspnea: Secondary | ICD-10-CM

## 2013-12-29 DIAGNOSIS — I4892 Unspecified atrial flutter: Secondary | ICD-10-CM

## 2013-12-29 DIAGNOSIS — Z8679 Personal history of other diseases of the circulatory system: Secondary | ICD-10-CM

## 2013-12-29 DIAGNOSIS — Z9889 Other specified postprocedural states: Secondary | ICD-10-CM

## 2013-12-29 DIAGNOSIS — R0600 Dyspnea, unspecified: Secondary | ICD-10-CM

## 2013-12-29 DIAGNOSIS — R0989 Other specified symptoms and signs involving the circulatory and respiratory systems: Secondary | ICD-10-CM

## 2013-12-29 NOTE — Patient Instructions (Addendum)
Your physician has recommended that you wear an 30 day event monitor. Event monitors are medical devices that record the heart's electrical activity. Doctors most often [place] these monitors to diagnose arrhythmias. Arrhythmias are problems with the speed or rhythm of the heartbeat. The monitor is a small, portable device. You can wear one while you do your normal daily activities. This is usually used to diagnose what is causing palpitations/syncope (passing out).  Your physician has recommended that you have a cardiopulmonary stress test (CPX). CPX testing is a non-invasive measurement of heart and lung function. It replaces a traditional treadmill stress test. This type of test provides a tremendous amount of information that relates not only to your present condition but also for future outcomes. This test combines measurements of you ventilation, respiratory gas exchange in the lungs, electrocardiogram (EKG), blood pressure and physical response before, during, and following an exercise protocol.  Your physician recommends that you have lab work today:BNP  Your physician recommends that you schedule a follow-up appointment in 6 weeks with DR. Caryl Comes

## 2013-12-29 NOTE — Progress Notes (Signed)
ELECTROPHYSIOLOGY OFFICE NOTE   Patient ID: Justin Huerta MRN: [id number], DOB/AGE: [birth year]   Date of Visit: 12/29/2013  Primary Physician: [name], MD Primary Cardiologist: Ena Dawley, MD  Primary EP: Jolyn Nap, MD  Reason for Visit: EP follow-up   History of Present Illness  Justin Huerta is a 53 y.o. male with atrial flutter, NICM, EF 20-25%, nonob CAD and myoblastoma in remission who underwent atrial flutter ablation 09/22/2013. While in atrial flutter, he experienced fatigue, chest "burning" pain, intermittent SOB and increased LE swelling. He did not have palpitations or syncope. He tolerated the ablation procedure well without complication. In the last few weeks he has noticed increased DOE and intermittent bilateral LE swelling. He had repeat echo which showed improved LVEF, 45%. He saw Dr. Caryl Comes 3 weeks and Coreg was stopped to see if this helped improve DOE. He presents today for follow-up. He has not noticed any change. He continues to experience DOE with minimal exertion. He denies CP. He denies palpitations, dizziness, near syncope or syncope. He has bilateral foot swelling but denies orthopnea or PND.   Past Medical History Past Medical History  Diagnosis Date  . Granular cell myoblastoma, malignant     a. myoblastoma  approx 1998, in remission. underwent gamma knife  procedure  . Tobacco abuse     16 pack history   . Chronic systolic CHF (congestive heart failure)     a. Echocardiogram  (09/01/13): Mild LVH, EF 30-35%, diffuse HK, mild AI mild MR, mild to moderate LAE, mild RAE, PASP 45  . Coronary artery disease     a. NSTEMI 08/2013 in setting of AFlutter with RVR (demand ischemia);  LHC (09/03/13): Distal left main 30, proximal LAD 40, mid LAD 40-50, mid AV groove circumflex 40, proximal OM1 30, ostial OM3 50, EF 20-25%.  Marland Kitchen NICM (nonischemic cardiomyopathy)   . Atrial flutter with rapid ventricular response   . Atrial fibrillation   . Seizures 2005-2006     "thought from myoblastoma" (09/21/2013)  . Pneumonia ~ 1964  . Exertional shortness of breath   . H/O hiatal hernia   . GERD (gastroesophageal reflux disease)   . VEHMCNOB(096.2)     "weekly" (09/21/2013)  . Anxiety     Past Surgical History Past Surgical History  Procedure Laterality Date  . Anterior cruciate ligament repair Left 1977  . Hiatal hernia repair  1990's  . Gamma knife surgery      myoblastoma ~1998  . Ablation  09/22/2013    CTI ablation by Dr Lovena Le    Allergies/Intolerances Allergies  Allergen Reactions  . Acetaminophen     "shaky" "makes me angry"  . Peanut Butter Flavor     Breathing problems, swells lips    Current Home Medications Current Outpatient Prescriptions  Medication Sig Dispense Refill  . Cholecalciferol (VITAMIN D) 2000 UNITS CAPS Take 2,000 Units by mouth daily.      . furosemide (LASIX) 40 MG tablet Take 0.5 tablets (20 mg total) by mouth daily.  15 tablet  3  . lisinopril (PRINIVIL,ZESTRIL) 20 MG tablet Take 1 tablet (20 mg total) by mouth daily.  90 tablet  3  . potassium chloride SA (K-DUR,KLOR-CON) 20 MEQ tablet Take 0.5 tablets (10 mEq total) by mouth daily.  15 tablet  11   No current facility-administered medications for this visit.    Social History History   Social History  . Marital Status: Single    Spouse Name: N/A    Number of Children:  2  . Years of Education: N/A   Occupational History  . Sales     Self employed    Social History Main Topics  . Smoking status: Current Every Day Smoker -- 0.12 packs/day for 16 years    Types: Cigarettes  . Smokeless tobacco: Never Used  . Alcohol Use: Yes     Comment: 09/21/2013 "probably 2 drinks/month"  . Drug Use: Yes    Special: Marijuana     Comment: 09/21/2013 "used to smoke marijuana twice/wk; since 08/31/2013 I don't smoke it at all"  . Sexual Activity: Yes   Other Topics Concern  . 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 He has 2 sons and lives with wife.      Review of Systems General: No chills, fever, night sweats or weight changes Cardiovascular: No chest pain, orthopnea, palpitations, paroxysmal nocturnal dyspnea Dermatological: No rash, lesions or masses Respiratory: No cough, dyspnea Urologic: No hematuria, dysuria Abdominal: No nausea, vomiting, diarrhea, bright red blood per rectum, melena, or hematemesis Neurologic: No visual changes, weakness, changes in mental status All other systems reviewed and are otherwise negative except as noted above.  Physical Exam Vitals: Blood pressure 140/86, pulse 66, height 6' (1.829 m), weight 201 lb (91.173 kg).  General: Well developed, well appearing 53 y.o. male in no acute distress. HEENT: Normocephalic, atraumatic. EOMs intact. Sclera nonicteric. Oropharynx clear.  Neck: Supple. No JVD. Lungs: Respirations regular and unlabored, CTA bilaterally. No wheezes, rales or rhonchi. Heart: RRR. S1, S2 present. No murmurs, rub, S3 or S4. Abdomen: Soft, non-distended.  Extremities: No clubbing, cyanosis or edema. PT/Radials 2+ and equal bilaterally. Psych: Normal affect. Neuro: Alert and oriented X 3. Moves all extremities spontaneously.   Diagnostics Echocardiogram Feb 2015 Study Conclusions - Left ventricle: The cavity size was mildly dilated. Wall thickness was increased in a pattern of mild LVH. Systolic function was mildly reduced. The estimated ejection fraction was 45%, in the range of 45% to 50%. Diffuse hypokinesis. - Aortic root: The aortic root was mildly dilated. - Left atrium: The atrium was mildly dilated. Impressions: - Limited study to evaluate LV function; doppler not performed; EF 45; improved compared to 09/02/13.  Assessment and Plan 1. Dyspnea on exertion 2. NICM, EF improved to 45% 3. Atrial flutter s/p CTI ablation  Check BNP Perform CPX testing  Wear 30-day event monitor Follow-up 6 weeks  with Dr. Caryl Comes  Signed, Adalida Garver, PA-C 12/29/2013, 5:50 PM

## 2013-12-30 LAB — BRAIN NATRIURETIC PEPTIDE: Pro B Natriuretic peptide (BNP): 60 pg/mL (ref 0.0–100.0)

## 2014-01-01 ENCOUNTER — Other Ambulatory Visit: Payer: Self-pay

## 2014-01-01 ENCOUNTER — Telehealth: Payer: Self-pay | Admitting: Internal Medicine

## 2014-01-01 DIAGNOSIS — I10 Essential (primary) hypertension: Secondary | ICD-10-CM

## 2014-01-01 MED ORDER — LISINOPRIL 20 MG PO TABS: 20.0000 mg | ORAL_TABLET | Freq: Every day | ORAL | Status: DC

## 2014-01-01 MED ORDER — FUROSEMIDE 20 MG PO TABS: 20.0000 mg | ORAL_TABLET | Freq: Every day | ORAL | Status: AC

## 2014-01-01 MED ORDER — LISINOPRIL 20 MG PO TABS: 20.0000 mg | ORAL_TABLET | Freq: Every day | ORAL | Status: AC

## 2014-01-01 MED ORDER — FUROSEMIDE 20 MG PO TABS: 20.0000 mg | ORAL_TABLET | Freq: Every day | ORAL | Status: DC

## 2014-01-01 NOTE — Telephone Encounter (Signed)
New message    Patient calling stating KimAlexis called him today

## 2014-01-01 NOTE — Telephone Encounter (Signed)
Spoke with patient and he is aware of results and Continue current plan for CPX testing and event monitor.

## 2014-01-12 ENCOUNTER — Ambulatory Visit (HOSPITAL_COMMUNITY): Payer: Medicaid Other | Attending: Internal Medicine

## 2014-01-12 DIAGNOSIS — R0609 Other forms of dyspnea: Secondary | ICD-10-CM

## 2014-01-12 DIAGNOSIS — R0989 Other specified symptoms and signs involving the circulatory and respiratory systems: Secondary | ICD-10-CM

## 2014-01-12 DIAGNOSIS — R0600 Dyspnea, unspecified: Secondary | ICD-10-CM

## 2014-02-05 ENCOUNTER — Ambulatory Visit: Payer: Medicaid Other | Attending: Internal Medicine | Admitting: Internal Medicine

## 2014-02-05 ENCOUNTER — Encounter: Payer: Self-pay | Admitting: Internal Medicine

## 2014-02-05 VITALS — BP 130/80 | HR 86 | Temp 97.7°F | Resp 16

## 2014-02-05 DIAGNOSIS — I10 Essential (primary) hypertension: Secondary | ICD-10-CM | POA: Diagnosis not present

## 2014-02-05 DIAGNOSIS — Z8701 Personal history of pneumonia (recurrent): Secondary | ICD-10-CM | POA: Insufficient documentation

## 2014-02-05 DIAGNOSIS — Z79899 Other long term (current) drug therapy: Secondary | ICD-10-CM | POA: Insufficient documentation

## 2014-02-05 DIAGNOSIS — I509 Heart failure, unspecified: Secondary | ICD-10-CM | POA: Insufficient documentation

## 2014-02-05 DIAGNOSIS — I2510 Atherosclerotic heart disease of native coronary artery without angina pectoris: Secondary | ICD-10-CM | POA: Insufficient documentation

## 2014-02-05 DIAGNOSIS — Z720 Tobacco use: Secondary | ICD-10-CM

## 2014-02-05 DIAGNOSIS — K219 Gastro-esophageal reflux disease without esophagitis: Secondary | ICD-10-CM | POA: Diagnosis not present

## 2014-02-05 DIAGNOSIS — I2589 Other forms of chronic ischemic heart disease: Secondary | ICD-10-CM | POA: Insufficient documentation

## 2014-02-05 DIAGNOSIS — R0981 Nasal congestion: Secondary | ICD-10-CM

## 2014-02-05 DIAGNOSIS — Z859 Personal history of malignant neoplasm, unspecified: Secondary | ICD-10-CM | POA: Insufficient documentation

## 2014-02-05 DIAGNOSIS — J329 Chronic sinusitis, unspecified: Secondary | ICD-10-CM | POA: Insufficient documentation

## 2014-02-05 DIAGNOSIS — R0982 Postnasal drip: Secondary | ICD-10-CM | POA: Diagnosis present

## 2014-02-05 DIAGNOSIS — I4891 Unspecified atrial fibrillation: Secondary | ICD-10-CM | POA: Insufficient documentation

## 2014-02-05 DIAGNOSIS — R05 Cough: Secondary | ICD-10-CM

## 2014-02-05 DIAGNOSIS — F17200 Nicotine dependence, unspecified, uncomplicated: Secondary | ICD-10-CM | POA: Diagnosis not present

## 2014-02-05 DIAGNOSIS — I5022 Chronic systolic (congestive) heart failure: Secondary | ICD-10-CM | POA: Insufficient documentation

## 2014-02-05 DIAGNOSIS — R059 Cough, unspecified: Secondary | ICD-10-CM | POA: Insufficient documentation

## 2014-02-05 DIAGNOSIS — J3489 Other specified disorders of nose and nasal sinuses: Secondary | ICD-10-CM

## 2014-02-05 MED ORDER — AMOXICILLIN-POT CLAVULANATE 875-125 MG PO TABS: 1.0000 | ORAL_TABLET | Freq: Two times a day (BID) | ORAL | Status: DC

## 2014-02-05 MED ORDER — BENZONATATE 100 MG PO CAPS: 100.0000 mg | ORAL_CAPSULE | Freq: Three times a day (TID) | ORAL | Status: AC | PRN

## 2014-02-05 MED ORDER — AMOXICILLIN-POT CLAVULANATE 875-125 MG PO TABS: 1.0000 | ORAL_TABLET | Freq: Two times a day (BID) | ORAL | Status: AC

## 2014-02-05 MED ORDER — FLUTICASONE PROPIONATE 50 MCG/ACT NA SUSP
2.0000 | Freq: Every day | NASAL | Status: AC
Start: 2014-02-05 — End: ?

## 2014-02-05 NOTE — Progress Notes (Signed)
Patient here for congestion, red eyes Stuffy nose

## 2014-02-05 NOTE — Progress Notes (Signed)
MRN: [id number] Name: Justin Huerta  Sex: male Age: 53 y.o. DOB: 10/28/1960  Allergies: Acetaminophen and Peanut butter flavor  Chief Complaint  Patient presents with  . Allergies    HPI: Patient is 53 y.o. male who comes today reported to have lot of stuff nose runny nose postnasal drip productive cough for the last few days as per patient he has tried over-the-counter nasal spray which helped a little bit denies any chest pain or shortness of breath initially his blood pressure was elevated repeat manual blood pressure is 130/80. Patient is to smoke cigarettes, have advised patient quit smoking.  Past Medical History  Diagnosis Date  . Granular cell myoblastoma, malignant     a. myoblastoma  approx 1998, in remission. underwent gamma knife  procedure  . Tobacco abuse     16 pack history   . Chronic systolic CHF (congestive heart failure)     a. Echocardiogram  (09/01/13): Mild LVH, EF 30-35%, diffuse HK, mild AI mild MR, mild to moderate LAE, mild RAE, PASP 45  . Coronary artery disease     a. NSTEMI 08/2013 in setting of AFlutter with RVR (demand ischemia);  LHC (09/03/13): Distal left main 30, proximal LAD 40, mid LAD 40-50, mid AV groove circumflex 40, proximal OM1 30, ostial OM3 50, EF 20-25%.  Marland Kitchen NICM (nonischemic cardiomyopathy)   . Atrial flutter with rapid ventricular response   . Atrial fibrillation   . Seizures 2005-2006    "thought from myoblastoma" (09/21/2013)  . Pneumonia ~ 1964  . Exertional shortness of breath   . H/O hiatal hernia   . GERD (gastroesophageal reflux disease)   . WCBJSEGB(151.7)     "weekly" (09/21/2013)  . Anxiety     Past Surgical History  Procedure Laterality Date  . Anterior cruciate ligament repair Left 1977  . Hiatal hernia repair  1990's  . Gamma knife surgery      myoblastoma ~1998  . Ablation  09/22/2013    CTI ablation by Dr Lovena Le      Medication List       This list is accurate as of: 02/05/14 12:49 PM.  Always use your  most recent med list.               amoxicillin-clavulanate 875-125 MG per tablet  Commonly known as:  AUGMENTIN  Take 1 tablet by mouth 2 (two) times daily.     benzonatate 100 MG capsule  Commonly known as:  TESSALON  Take 1 capsule (100 mg total) by mouth 3 (three) times daily as needed for cough.     fluticasone 50 MCG/ACT nasal spray  Commonly known as:  FLONASE  Place 2 sprays into both nostrils daily.     furosemide 20 MG tablet  Commonly known as:  LASIX  Take 1 tablet (20 mg total) by mouth daily.     hydroxypropyl methylcellulose 2.5 % ophthalmic solution  Commonly known as:  ISOPTO TEARS  1 drop.     lisinopril 20 MG tablet  Commonly known as:  PRINIVIL,ZESTRIL  Take 1 tablet (20 mg total) by mouth daily.     potassium chloride SA 20 MEQ tablet  Commonly known as:  K-DUR,KLOR-CON  Take 0.5 tablets (10 mEq total) by mouth daily.     sodium chloride 0.65 % Soln nasal spray  Commonly known as:  OCEAN  Place 1 spray into both nostrils as needed for congestion.     Vitamin D 2000 UNITS Caps  Take 2,000  Units by mouth daily.        Meds ordered this encounter  Medications  . sodium chloride (OCEAN) 0.65 % SOLN nasal spray    Sig: Place 1 spray into both nostrils as needed for congestion.  . hydroxypropyl methylcellulose (ISOPTO TEARS) 2.5 % ophthalmic solution    Sig: 1 drop.  Marland Kitchen amoxicillin-clavulanate (AUGMENTIN) 875-125 MG per tablet    Sig: Take 1 tablet by mouth 2 (two) times daily.    Dispense:  20 tablet    Refill:  0  . benzonatate (TESSALON) 100 MG capsule    Sig: Take 1 capsule (100 mg total) by mouth 3 (three) times daily as needed for cough.    Dispense:  30 capsule    Refill:  1  . fluticasone (FLONASE) 50 MCG/ACT nasal spray    Sig: Place 2 sprays into both nostrils daily.    Dispense:  16 g    Refill:  6    Immunization History  Administered Date(s) Administered  . Influenza,inj,Quad PF,36+ Mos 09/30/2013    Family History    Problem Relation Age of Onset  . Heart attack Mother     alive  . Heart disease Mother   . Hypertension Mother     History  Substance Use Topics  . Smoking status: Current Every Day Smoker -- 0.12 packs/day for 16 years    Types: Cigarettes  . Smokeless tobacco: Never Used  . Alcohol Use: Yes     Comment: 09/21/2013 "probably 2 drinks/month"    Review of Systems   As noted in HPI  Filed Vitals:   02/05/14 1238  BP: 130/80  Pulse:   Temp:   Resp:     Physical Exam  Physical Exam  HENT:  Nasal congestion sinus tenderness minimal pharyngeal erythema no exudate.  Eyes: EOM are normal. Pupils are equal, round, and reactive to light.  Cardiovascular: Normal rate and regular rhythm.   Pulmonary/Chest: Breath sounds normal. No respiratory distress. He has no wheezes. He has no rales.  Musculoskeletal: He exhibits no edema.    CBC    Component Value Date/Time   WBC 4.8 10/11/2013 1830   RBC 3.88* 10/11/2013 1830   HGB 13.3 10/11/2013 1830   HCT 37.8* 10/11/2013 1830   PLT 268 10/11/2013 1830   MCV 97.4 10/11/2013 1830   LYMPHSABS 1.5 09/21/2013 1432   MONOABS 0.3 09/21/2013 1432   EOSABS 0.1 09/21/2013 1432   BASOSABS 0.0 09/21/2013 1432    CMP     Component Value Date/Time   NA 138 10/11/2013 1830   K 3.5* 10/11/2013 1830   CL 98 10/11/2013 1830   CO2 26 10/11/2013 1830   GLUCOSE 82 10/11/2013 1830   BUN 16 10/11/2013 1830   CREATININE 1.19 10/11/2013 1830   CREATININE 1.27 09/30/2013 1043   CALCIUM 9.1 10/11/2013 1830   PROT 7.4 09/30/2013 1043   ALBUMIN 3.7 09/30/2013 1043   AST 21 09/30/2013 1043   ALT 16 09/30/2013 1043   ALKPHOS 53 09/30/2013 1043   BILITOT 0.4 09/30/2013 1043   GFRNONAA 68* 10/11/2013 1830   GFRNONAA 64 09/30/2013 1043   GFRAA 78* 10/11/2013 1830   GFRAA 74 09/30/2013 1043    Lab Results  Component Value Date/Time   CHOL 163 09/02/2013  9:40 AM    No components found with this basename: hga1c    Lab Results  Component Value  Date/Time   AST 21 09/30/2013 10:43 AM    Assessment and Plan  HTN (  hypertension) Controlled continue with current medications.  Tobacco abuse Advise patient quit smoking.    Nasal congestion - Plan: fluticasone (FLONASE) 50 MCG/ACT nasal spray  Sinusitis - Plan: amoxicillin-clavulanate (AUGMENTIN) 875-125 MG per tablet  Cough - Plan: benzonatate (TESSALON) 100 MG capsule Advised patient for saltwater gargles.    Return in about 4 months (around 06/08/2014), or if symptoms worsen or fail to improve, for hypertension.  [name], MD

## 2014-02-09 ENCOUNTER — Ambulatory Visit (INDEPENDENT_AMBULATORY_CARE_PROVIDER_SITE_OTHER): Payer: Medicaid Other | Admitting: Internal Medicine

## 2014-02-09 ENCOUNTER — Encounter: Payer: Self-pay | Admitting: Internal Medicine

## 2014-02-09 VITALS — BP 124/86 | HR 73 | Ht 71.0 in | Wt 203.0 lb

## 2014-02-09 DIAGNOSIS — I4892 Unspecified atrial flutter: Secondary | ICD-10-CM

## 2014-02-09 DIAGNOSIS — R0609 Other forms of dyspnea: Secondary | ICD-10-CM

## 2014-02-09 DIAGNOSIS — R0989 Other specified symptoms and signs involving the circulatory and respiratory systems: Secondary | ICD-10-CM

## 2014-02-09 DIAGNOSIS — I428 Other cardiomyopathies: Secondary | ICD-10-CM

## 2014-02-09 NOTE — Progress Notes (Signed)
Patient Care Team: [name], MD as PCP - General (Internal Medicine)   HPI  Justin Huerta is a 53 y.o. male Seen in followup for atrial flutter for which he underwent catheter ablation 12/14. At that time he was noted that ejection fraction of 20-30%. Following ablation repeat echo 2/15 demonstrated interval improvement 45-50% with modest left atrial enlargement.  This continued to complain of dyspnea on exertion. He underwent CPX showed mostly deconditioning  Past Medical History  Diagnosis Date  . Granular cell myoblastoma, malignant     a. myoblastoma  approx 1998, in remission. underwent gamma knife  procedure  . Tobacco abuse     16 pack history   . Chronic systolic CHF (congestive heart failure)     a. Echocardiogram  (09/01/13): Mild LVH, EF 30-35%, diffuse HK, mild AI mild MR, mild to moderate LAE, mild RAE, PASP 45  . Coronary artery disease     a. NSTEMI 08/2013 in setting of AFlutter with RVR (demand ischemia);  LHC (09/03/13): Distal left main 30, proximal LAD 40, mid LAD 40-50, mid AV groove circumflex 40, proximal OM1 30, ostial OM3 50, EF 20-25%.  Marland Kitchen NICM (nonischemic cardiomyopathy)   . Atrial flutter with rapid ventricular response   . Atrial fibrillation   . Seizures 2005-2006    "thought from myoblastoma" (09/21/2013)  . Pneumonia ~ 1964  . Exertional shortness of breath   . H/O hiatal hernia   . GERD (gastroesophageal reflux disease)   . RWERXVQM(086.7)     "weekly" (09/21/2013)  . Anxiety     Past Surgical History  Procedure Laterality Date  . Anterior cruciate ligament repair Left 1977  . Hiatal hernia repair  1990's  . Gamma knife surgery      myoblastoma ~1998  . Ablation  09/22/2013    CTI ablation by Dr Lovena Le    Current Outpatient Prescriptions  Medication Sig Dispense Refill  . amoxicillin-clavulanate (AUGMENTIN) 875-125 MG per tablet Take 1 tablet by mouth 2 (two) times daily.  20 tablet  0  . benzonatate (TESSALON) 100 MG  capsule Take 1 capsule (100 mg total) by mouth 3 (three) times daily as needed for cough.  30 capsule  1  . Cholecalciferol (VITAMIN D) 2000 UNITS CAPS Take 2,000 Units by mouth daily.      . fluticasone (FLONASE) 50 MCG/ACT nasal spray Place 2 sprays into both nostrils daily.  16 g  6  . furosemide (LASIX) 20 MG tablet Take 1 tablet (20 mg total) by mouth daily.  90 tablet  3  . hydroxypropyl methylcellulose (ISOPTO TEARS) 2.5 % ophthalmic solution 1 drop.      Marland Kitchen lisinopril (PRINIVIL,ZESTRIL) 20 MG tablet Take 1 tablet (20 mg total) by mouth daily.  90 tablet  3  . potassium chloride SA (K-DUR,KLOR-CON) 20 MEQ tablet Take 0.5 tablets (10 mEq total) by mouth daily.  15 tablet  11  . sodium chloride (OCEAN) 0.65 % SOLN nasal spray Place 1 spray into both nostrils as needed for congestion.       No current facility-administered medications for this visit.    Allergies  Allergen Reactions  . Acetaminophen     "shaky" "makes me angry"  . Peanut Butter Flavor     Breathing problems, swells lips    Review of Systems negative except from HPI and PMH  Physical Exam BP 124/86  Pulse 73  Ht 5\' 11"  (1.803 m)  Wt 203 lb (92.08 kg)  BMI 28.33  kg/m2 Well developed and well nourished in no acute distress HENT normal E scleral and icterus clear Neck Supple JVP flat; carotids brisk and full Clear to ausculation  Regular rate and rhythm, no murmurs gallops or rub Soft with active bowel sounds No clubbing cyanosis none Edema Alert and oriented, grossly normal motor and sensory function Skin Warm and Dry  ECG demonstrates sinus rhythm with occasional PACs. Intervals are 09/11/41   Vague impression a delta wave but nothing was seen as I recall EP testing. Left axis deviation -39  Assessment and  Plan  Atrial flutter s/p ablation   Dyspnea on Exertion  Hypertension  Tobacco use  He is doing relatively well.   I have spoken with Dr Georg Ruddle to see if we could transfer the care as he  lives [place] current meds   Encouraged to stop smoking

## 2014-02-09 NOTE — Patient Instructions (Signed)
Your physician recommends that you continue on your current medications as directed. Please refer to the Current Medication list given to you today.  No follow up needed at this time with Dr. Klein.   

## 2014-07-03 IMAGING — CT CT ANGIO CHEST
2 of 7 series · 18 of 36 positions shown · IV contrast (CONTRAST)
Comparison: None.

CLINICAL DATA: Mid chest pressure/tightness

EXAM:
CT ANGIOGRAPHY CHEST WITH CONTRAST
TECHNIQUE: Multidetector CT imaging of the chest was performed using the
standard protocol during bolus administration of intravenous
contrast. Multiplanar CT image reconstructions including MIPs were
obtained to evaluate the vascular anatomy.
CONTRAST:  100mL OMNIPAQUE IOHEXOL 350 MG/ML SOLN

[Series 6: pe thins · axial · 0.68mm/px · z∈[-304,+5]mm · 17 of 349 slices shown]
[im 20/349  lung]
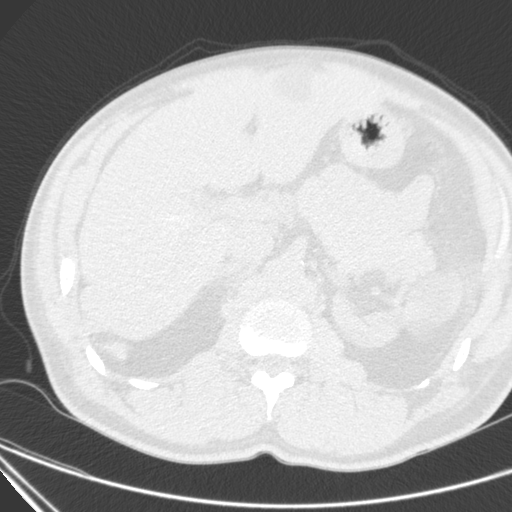
[im 39/349  mediastinal]
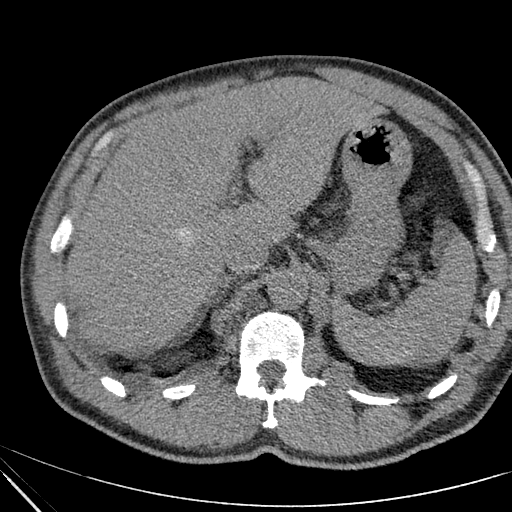
[im 59/349  lung]
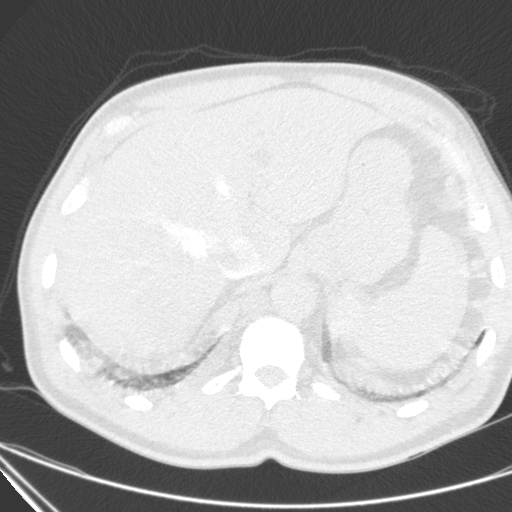
[im 78/349  mediastinal]
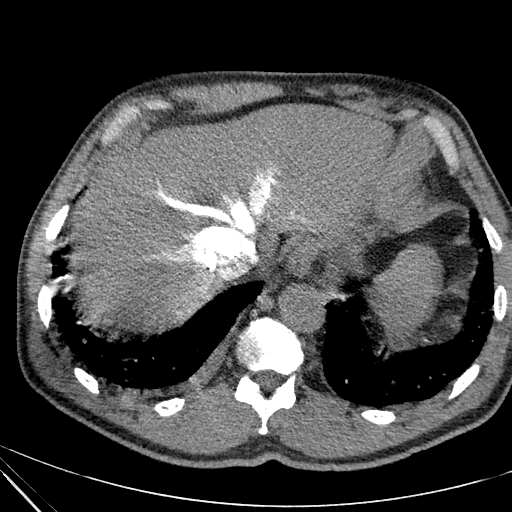
[im 97/349  lung]
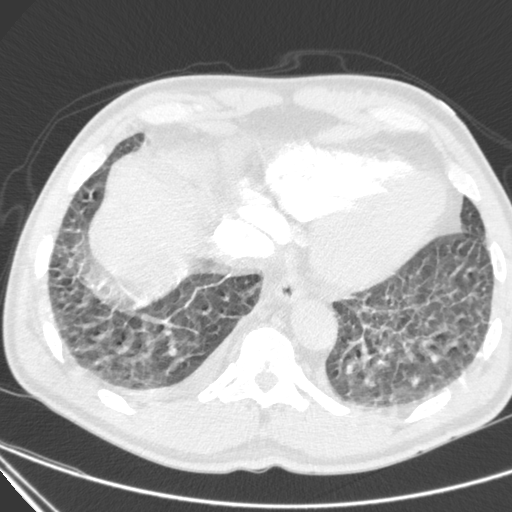
[im 117/349  mediastinal]
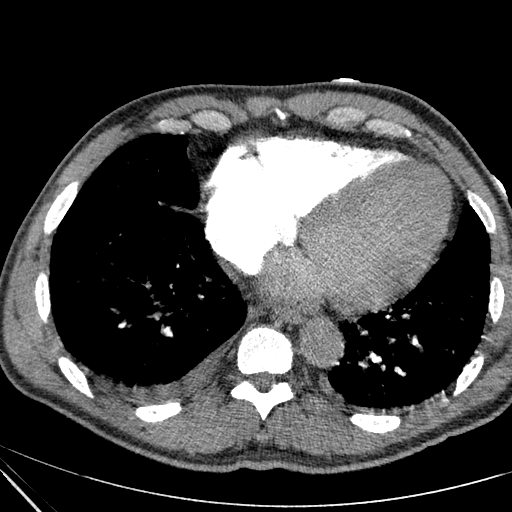
[im 136/349  lung]
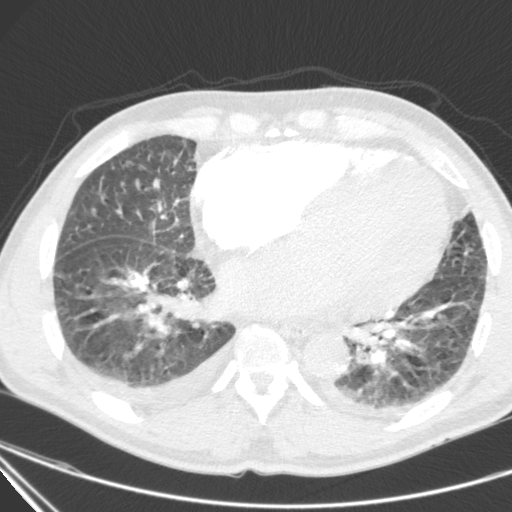
[im 155/349  mediastinal]
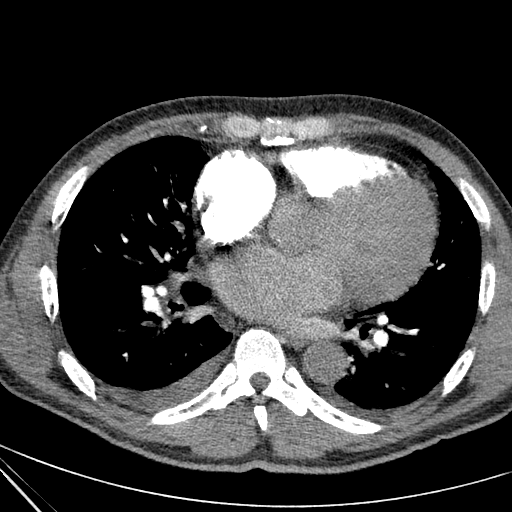
[im 175/349  lung]
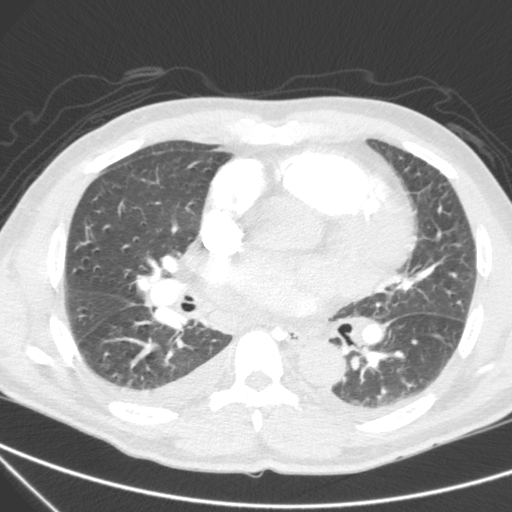
[im 194/349  mediastinal]
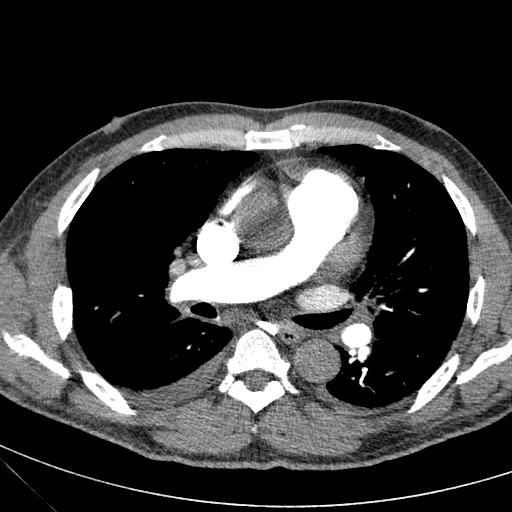
[im 213/349  lung]
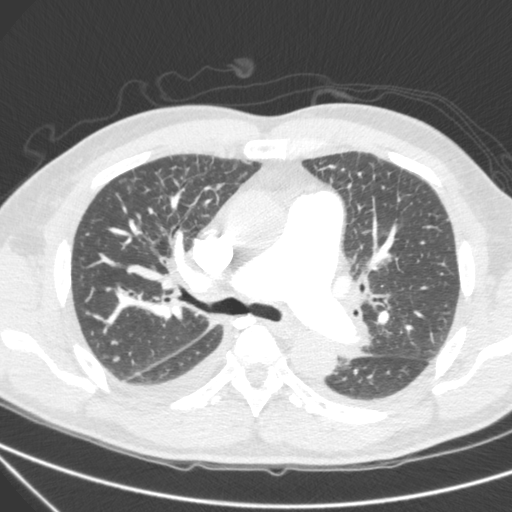
[im 233/349  mediastinal]
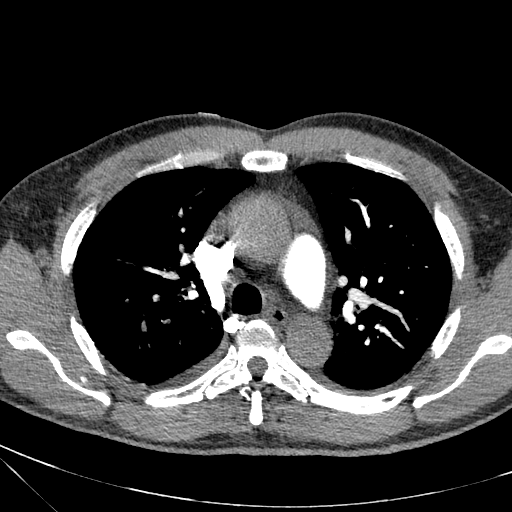
[im 252/349  lung]
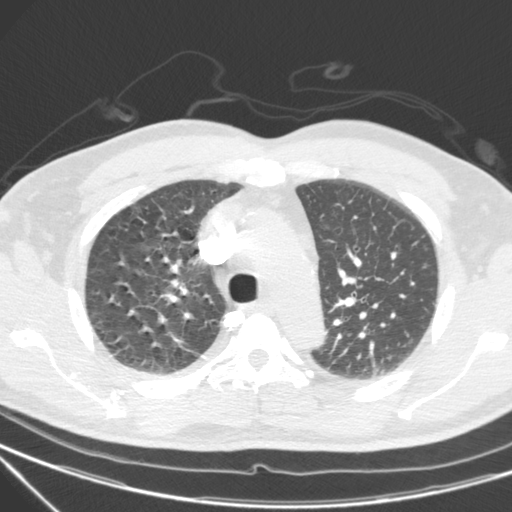
[im 271/349  mediastinal]
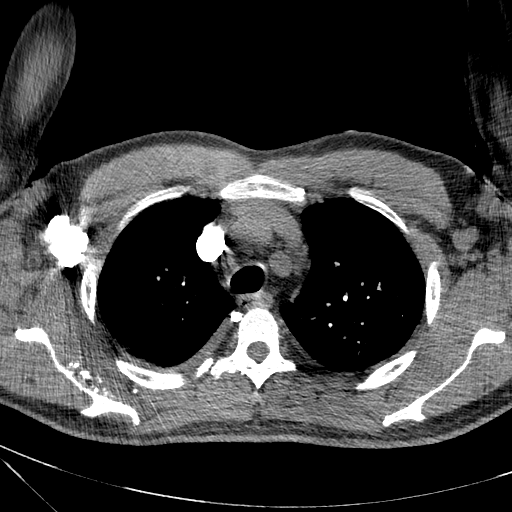
[im 291/349  lung]
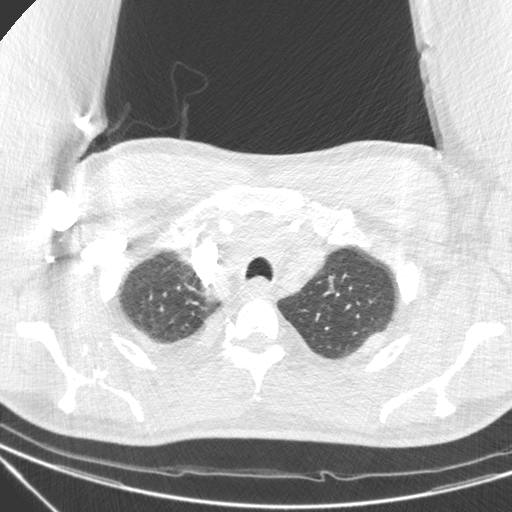
[im 310/349  mediastinal]
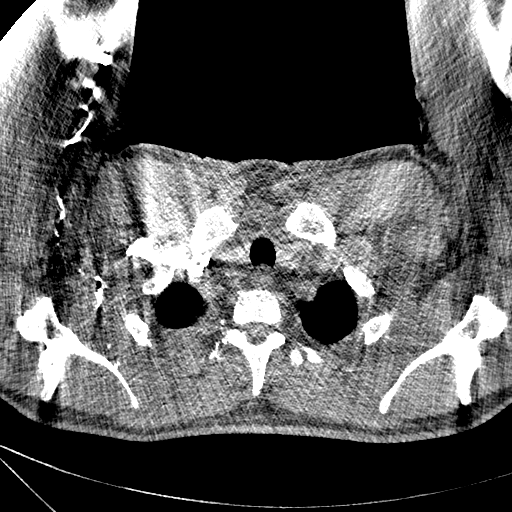
[im 329/349  lung]
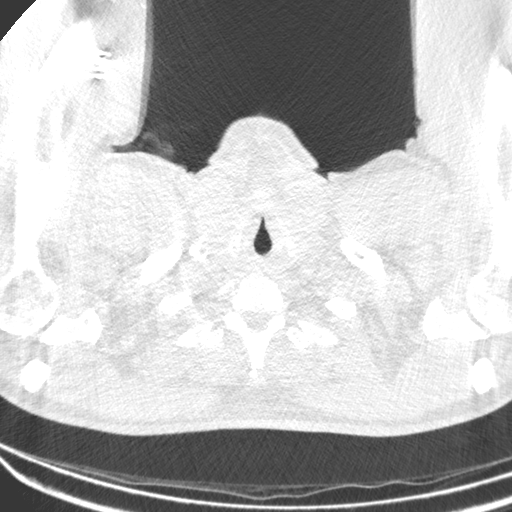

[Series 8068: mpr, coronals, coronal · coronal · 0.68mm/px · 1 of 109 slices shown]
[im 55/109  mediastinal]
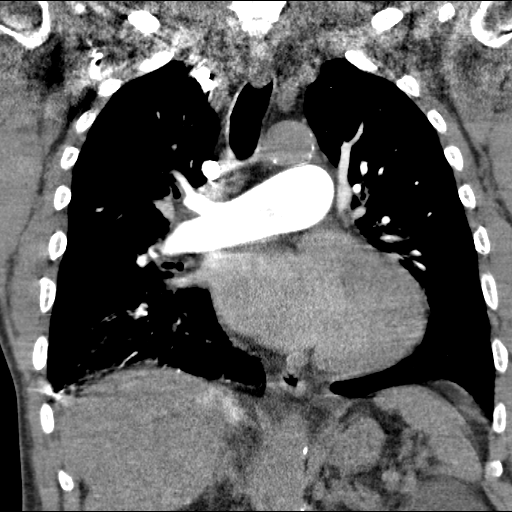

[18 of 36 positions shown; findings below may reference images not displayed]

FINDINGS: Lower lobe segmental pulmonary arterial branches are nondiagnostic
due to respiratory motion. Within this limitation, no pulmonary
embolism identified elsewhere. The main pulmonary artery dilates up
to 3.8 cm.

Scattered atherosclerosis of the aorta and branch vessels. Mild
focal outpouching along the arch, with the aorta measuring up to
cm. Trace pericardial fluid. Cardiomegaly. Coronary artery
calcification. Small right and trace left pleural effusions.

Central airways are patent. Peribronchial thickening. Lower lobe
interlobular septal thickening. Dependent and/or compressive lower
lobe atelectasis.

Limited upper abdominal images show a few hypodensities within the
left hepatic lobe, favored reflects cyst. Partially imaged cyst
arising from the upper pole left kidney measures water attenuation.
Mildly prominent mediastinal lymph nodes measuring up to 1 cm short
axis, likely reactive.

No acute osseous finding.

Review of the MIP images confirms the above findings.
IMPRESSION: Degraded by respiratory motion. Lower lobe segmental and more
peripheral branches are nondiagnostic. Otherwise, no pulmonary
embolism identified.

Dilatation of the main pulmonary artery can be seen in the setting
of pulmonary arterial hypertension.

Cardiomegaly with small pleural effusions and a pulmonary edema
pattern.

## 2014-08-12 IMAGING — CR DG CHEST 1V PORT
1 series · 1 of 1 positions shown · non-contrast
Comparison: 09/01/2013

CLINICAL DATA: Chest pain

EXAM:
PORTABLE CHEST - 1 VIEW

[AP]
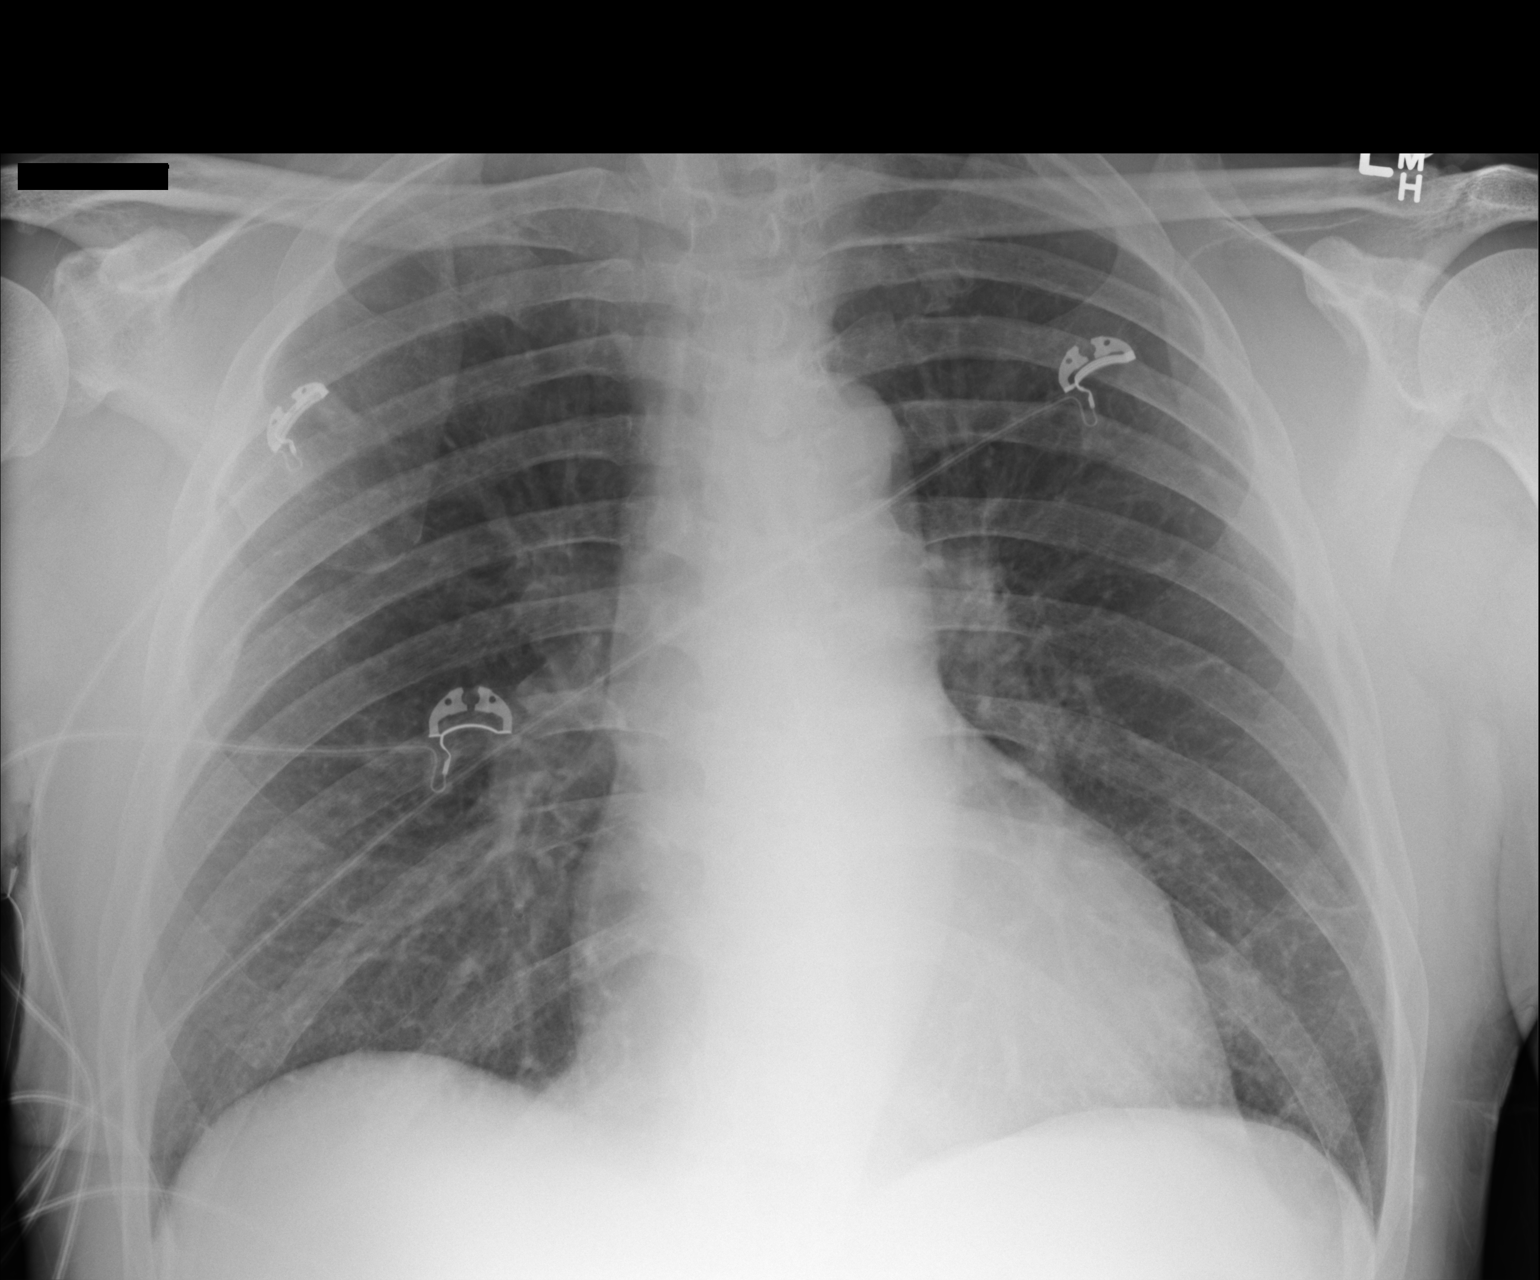

[1 of 1 positions shown; findings below may reference images not displayed]

FINDINGS: Cardiomediastinal silhouette is stable. No acute infiltrate or
pleural effusion. No pulmonary edema. Stable old fracture of the
right 5th rib .
IMPRESSION: No active disease.

## 2014-09-09 ENCOUNTER — Encounter (HOSPITAL_COMMUNITY): Payer: Self-pay | Admitting: Internal Medicine

## 2015-05-17 ENCOUNTER — Encounter: Payer: Self-pay | Admitting: Pharmacist

## 2021-07-01 DEATH — deceased
